# Patient Record
Sex: Female | Born: 2000 | Race: Black or African American | Hispanic: No | Marital: Single | Smoking: Former smoker
Health system: Southern US, Community
[De-identification: ages and names within clinical notes are randomized; demographics above are authoritative.]

## PROBLEM LIST (undated history)

## (undated) ENCOUNTER — Inpatient Hospital Stay (HOSPITAL_COMMUNITY): Payer: MEDICAID

## (undated) ENCOUNTER — Inpatient Hospital Stay (HOSPITAL_COMMUNITY): Payer: Self-pay

## (undated) DIAGNOSIS — R55 Syncope and collapse: Secondary | ICD-10-CM

## (undated) DIAGNOSIS — F319 Bipolar disorder, unspecified: Secondary | ICD-10-CM

## (undated) DIAGNOSIS — T7422XA Child sexual abuse, confirmed, initial encounter: Secondary | ICD-10-CM

## (undated) DIAGNOSIS — J45909 Unspecified asthma, uncomplicated: Secondary | ICD-10-CM

## (undated) DIAGNOSIS — F4320 Adjustment disorder, unspecified: Secondary | ICD-10-CM

## (undated) HISTORY — PX: NO PAST SURGERIES: SHX2092

## (undated) HISTORY — DX: Unspecified asthma, uncomplicated: J45.909

## (undated) HISTORY — DX: Syncope and collapse: R55

## (undated) HISTORY — DX: Child sexual abuse, confirmed, initial encounter: T74.22XA

## (undated) HISTORY — DX: Adjustment disorder, unspecified: F43.20

---
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
## 2021-03-24 ENCOUNTER — Inpatient Hospital Stay (HOSPITAL_COMMUNITY)
Admission: AD | Admit: 2021-03-24 | Discharge: 2021-03-24 | Disposition: A | Discharge status: Home / Self Care | Payer: Medicaid Other | Attending: Obstetrics and Gynecology | Admitting: Obstetrics and Gynecology

## 2021-03-24 ENCOUNTER — Other Ambulatory Visit: Payer: Self-pay

## 2021-03-24 ENCOUNTER — Encounter (HOSPITAL_COMMUNITY): Payer: Self-pay | Admitting: Obstetrics and Gynecology

## 2021-03-24 DIAGNOSIS — Z3A32 32 weeks gestation of pregnancy: Secondary | ICD-10-CM | POA: Diagnosis not present

## 2021-03-24 DIAGNOSIS — O99891 Other specified diseases and conditions complicating pregnancy: Secondary | ICD-10-CM | POA: Diagnosis not present

## 2021-03-24 DIAGNOSIS — R197 Diarrhea, unspecified: Secondary | ICD-10-CM | POA: Diagnosis not present

## 2021-03-24 DIAGNOSIS — R1032 Left lower quadrant pain: Secondary | ICD-10-CM | POA: Insufficient documentation

## 2021-03-24 DIAGNOSIS — R10817 Generalized abdominal tenderness: Secondary | ICD-10-CM | POA: Diagnosis not present

## 2021-03-24 DIAGNOSIS — O99613 Diseases of the digestive system complicating pregnancy, third trimester: Secondary | ICD-10-CM | POA: Insufficient documentation

## 2021-03-24 DIAGNOSIS — O26893 Other specified pregnancy related conditions, third trimester: Secondary | ICD-10-CM | POA: Insufficient documentation

## 2021-03-24 DIAGNOSIS — N9489 Other specified conditions associated with female genital organs and menstrual cycle: Secondary | ICD-10-CM | POA: Diagnosis not present

## 2021-03-24 DIAGNOSIS — Z87891 Personal history of nicotine dependence: Secondary | ICD-10-CM | POA: Diagnosis not present

## 2021-03-24 LAB — URINALYSIS, ROUTINE W REFLEX MICROSCOPIC
Bilirubin Urine: NEGATIVE
Glucose, UA: 250 mg/dL — AB
Hgb urine dipstick: NEGATIVE
Ketones, ur: 15 mg/dL — AB
Leukocytes,Ua: NEGATIVE
Nitrite: NEGATIVE
Protein, ur: NEGATIVE mg/dL
Specific Gravity, Urine: 1.02 (ref 1.005–1.030)
pH: 7 (ref 5.0–8.0)

## 2021-03-24 MED ORDER — DICYCLOMINE HCL 10 MG PO CAPS
20.0000 mg | ORAL_CAPSULE | Freq: Once | ORAL | Status: AC
  Administered 2021-03-24: 20 mg via ORAL
  Filled 2021-03-24: qty 2

## 2021-03-24 MED ORDER — DICYCLOMINE HCL 10 MG PO CAPS: 10.0000 mg | ORAL_CAPSULE | Freq: Four times a day (QID) | ORAL | 0 refills | Status: DC | PRN

## 2021-03-24 MED ORDER — LOPERAMIDE HCL 2 MG PO CAPS
2.0000 mg | ORAL_CAPSULE | Freq: Once | ORAL | Status: AC
  Administered 2021-03-24: 2 mg via ORAL
  Filled 2021-03-24: qty 1

## 2021-03-24 MED ORDER — TERCONAZOLE 0.4 % VA CREA: 1.0000 | TOPICAL_CREAM | Freq: Every day | VAGINAL | 0 refills | Status: DC
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
## 2021-03-24 NOTE — MAU Note (Signed)
Taylor Brennan is a 20 y.o. at [redacted]w[redacted]d here in MAU reporting: for the past 3 days has been having sharp left sided abdominal pain. Pain is intermittent. Also has has been having diarrhea.  Onset of complaint: ongoing  Pain score: 8/10  Vitals:   03/24/21 1433  BP: 119/74  Pulse: (!) 116  Resp: 16  Temp: 98.4 F (36.9 C)  SpO2: 98%     FHT: +FM  Lab orders placed from triage: UA

## 2021-03-25 ENCOUNTER — Encounter (HOSPITAL_COMMUNITY): Payer: Self-pay | Admitting: Obstetrics and Gynecology

## 2021-03-25 ENCOUNTER — Inpatient Hospital Stay (HOSPITAL_COMMUNITY)
Admission: AD | Admit: 2021-03-25 | Discharge: 2021-03-25 | Disposition: A | Discharge status: Home / Self Care | Payer: Medicaid Other | Attending: Obstetrics and Gynecology | Admitting: Obstetrics and Gynecology

## 2021-03-25 DIAGNOSIS — Z3A32 32 weeks gestation of pregnancy: Secondary | ICD-10-CM | POA: Diagnosis not present

## 2021-03-25 DIAGNOSIS — R1032 Left lower quadrant pain: Secondary | ICD-10-CM | POA: Diagnosis not present

## 2021-03-25 DIAGNOSIS — O99891 Other specified diseases and conditions complicating pregnancy: Secondary | ICD-10-CM | POA: Insufficient documentation

## 2021-03-25 DIAGNOSIS — N9489 Other specified conditions associated with female genital organs and menstrual cycle: Secondary | ICD-10-CM | POA: Diagnosis not present

## 2021-03-25 DIAGNOSIS — R102 Pelvic and perineal pain: Secondary | ICD-10-CM | POA: Diagnosis not present

## 2021-03-25 DIAGNOSIS — Z7982 Long term (current) use of aspirin: Secondary | ICD-10-CM | POA: Insufficient documentation

## 2021-03-25 DIAGNOSIS — Z3689 Encounter for other specified antenatal screening: Secondary | ICD-10-CM

## 2021-03-25 DIAGNOSIS — O26893 Other specified pregnancy related conditions, third trimester: Secondary | ICD-10-CM

## 2021-03-25 DIAGNOSIS — N949 Unspecified condition associated with female genital organs and menstrual cycle: Secondary | ICD-10-CM

## 2021-03-25 DIAGNOSIS — R109 Unspecified abdominal pain: Secondary | ICD-10-CM | POA: Diagnosis present

## 2021-03-25 DIAGNOSIS — R197 Diarrhea, unspecified: Secondary | ICD-10-CM | POA: Diagnosis not present

## 2021-03-25 DIAGNOSIS — R10817 Generalized abdominal tenderness: Secondary | ICD-10-CM | POA: Diagnosis not present

## 2021-03-25 LAB — URINALYSIS, ROUTINE W REFLEX MICROSCOPIC
Bilirubin Urine: NEGATIVE
Glucose, UA: NEGATIVE mg/dL
Hgb urine dipstick: NEGATIVE
Ketones, ur: NEGATIVE mg/dL
Leukocytes,Ua: NEGATIVE
Nitrite: NEGATIVE
Protein, ur: NEGATIVE mg/dL
Specific Gravity, Urine: 1.012 (ref 1.005–1.030)
pH: 7 (ref 5.0–8.0)

## 2021-03-25 NOTE — MAU Note (Signed)
Was here yesterday, told to come back if pain got worse.  Pt is having sharp pain in LLQ.  Worse when she walks. States pain has been constant for 4 days now. Denies bleeding or leaking.  Reports heavy d/c. +FM. Has been having diarrhea, start 4 days ago, about the same time. Loose 5/day. No urinary problems.
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
## 2021-04-05 LAB — OB RESULTS CONSOLE GBS: GBS: POSITIVE

## 2021-05-09 ENCOUNTER — Telehealth (HOSPITAL_COMMUNITY): Payer: Self-pay | Admitting: *Deleted

## 2021-05-09 NOTE — Telephone Encounter (Signed)
Preadmission screen  

## 2021-05-13 ENCOUNTER — Encounter (HOSPITAL_COMMUNITY): Payer: Self-pay | Admitting: Obstetrics and Gynecology

## 2021-05-13 ENCOUNTER — Inpatient Hospital Stay (HOSPITAL_COMMUNITY)
Admission: AD | Admit: 2021-05-13 | Discharge: 2021-05-13 | Disposition: A | Discharge status: Home / Self Care | Payer: Medicaid Other | Attending: Obstetrics and Gynecology | Admitting: Obstetrics and Gynecology

## 2021-05-13 DIAGNOSIS — O479 False labor, unspecified: Secondary | ICD-10-CM

## 2021-05-13 DIAGNOSIS — Z3A39 39 weeks gestation of pregnancy: Secondary | ICD-10-CM

## 2021-05-13 DIAGNOSIS — O471 False labor at or after 37 completed weeks of gestation: Secondary | ICD-10-CM | POA: Diagnosis not present

## 2021-05-13 DIAGNOSIS — Z3689 Encounter for other specified antenatal screening: Secondary | ICD-10-CM

## 2021-05-13 DIAGNOSIS — O4700 False labor before 37 completed weeks of gestation, unspecified trimester: Secondary | ICD-10-CM | POA: Diagnosis present

## 2021-05-13 NOTE — MAU Provider Note (Signed)
Per RN: Ms. Jeslie Lowe is a 21 y.o. G1P0000 at [redacted]w[redacted]d  who presents to MAU today complaining contractions q 15 minutes since last night. No VB or LOF. +FM.   O: BP 120/76   Pulse 96   Temp 98.7 F (37.1 C) (Oral)   Resp 16   LMP 07/16/2020   SpO2 98%   Cervical exam:  Dilation: 1 Effacement (%): 50 Cervical Position: Posterior Station: -2 Presentation: Vertex Exam by:: weston,rn  Fetal Monitoring: Baseline: 135 Variability: mod Accelerations: + Decelerations: no Contractions: irreg  MDM: no cervical change, no signs of labor. Stable for discharge home.   A: SIUP at [redacted]w[redacted]d  False labor Reactive NST  P: Discharge home Follow up [facility] as scheduled Labor precautions  [facility], CNM 05/13/2021 7:25 PM

## 2021-05-13 NOTE — ED Provider Notes (Signed)
Emergency Medicine Provider OB Triage Evaluation Note  Ileanna Bueno is a 20 y.o. female, G1P0000, at [redacted]w[redacted]d gestation who presents to the emergency department with complaints of contractions.  Patient reports that she has been having contractions since last night they were initially irregular but over the past hour she has been having them consistently every 15 minutes.  Currently she does not feel like she is having a contraction, last one was 10 minutes ago.  She is not feeling the urge to push.  She has not had any loss of fluids or vaginal bleeding.  Does report having some vaginal discharge intermittently.  No fevers or chills.  No nausea or vomiting.  Review of  Systems  Positive: Abdominal pain, contractions Negative: Vaginal bleeding, leakage of fluids, fevers, vomiting  Physical Exam  BP (!) 138/95 (BP Location: Left Arm)   Pulse (!) 117   Temp 99 F (37.2 C)   Resp 18   LMP 07/16/2020   SpO2 100%  General: Awake, no distress  HEENT: Atraumatic  Resp: Normal effort  Cardiac: Normal rate Abd: Gravid abdomen, nontender MSK: Moves all extremities without difficulty Neuro: Speech clear  Medical Decision Making  Pt evaluated for pregnancy concern and is stable for transfer to MAU. Pt is in agreement with plan for transfer.  5:34 PM Discussed with MAU provider, Dr. Adrian Blackwater, who accepts patient in transfer.  Clinical Impression   1. Uterine contractions   2. [redacted] weeks gestation of pregnancy        Legrand Rams 05/13/21 1736    Terald Sleeper, MD 05/13/21 1744

## 2021-05-13 NOTE — MAU Note (Signed)
.  Taylor Brennan is a 20 y.o. at [redacted]w[redacted]d here in MAU reporting: ctx q15 that started last night. She states they got worse about 30 minutes ago and she came in to get checked out. Denies VB or LOF. Endorses good fetal movement. States she has been having some diarrhea since last night as well. Denies n/v.   Pain score: 5 Vitals:   05/13/21 1729 05/13/21 1752  BP: (!) 138/95 110/68  Pulse: (!) 117 (!) 119  Resp: 18 15  Temp: 99 F (37.2 C) 98.7 F (37.1 C)  SpO2: 100% 98%     FHT:141

## 2021-05-15 ENCOUNTER — Encounter (HOSPITAL_COMMUNITY): Payer: Self-pay

## 2021-05-15 ENCOUNTER — Ambulatory Visit (HOSPITAL_COMMUNITY)
Admission: RE | Admit: 2021-05-15 | Discharge: 2021-05-15 | Disposition: A | Discharge status: Home / Self Care | Payer: Medicaid Other | Source: Ambulatory Visit | Attending: Family Medicine | Admitting: Family Medicine

## 2021-05-15 ENCOUNTER — Other Ambulatory Visit: Payer: Self-pay

## 2021-05-15 VITALS — BP 125/73 | HR 111 | Temp 97.8°F | Resp 19

## 2021-05-15 DIAGNOSIS — M654 Radial styloid tenosynovitis [de Quervain]: Secondary | ICD-10-CM | POA: Diagnosis not present

## 2021-05-15 MED ORDER — PREDNISONE 20 MG PO TABS: 40.0000 mg | ORAL_TABLET | Freq: Every day | ORAL | 0 refills | Status: DC

## 2021-05-15 NOTE — ED Triage Notes (Signed)
Pt presents with complaints of pain in her left thumb x 1 week. The patient injured her thumb last year. She thinks she slept on it wrong and flared up some pain.
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
## 2021-05-16 ENCOUNTER — Inpatient Hospital Stay (HOSPITAL_COMMUNITY)
Admission: AD | Admit: 2021-05-16 | Discharge: 2021-05-19 | DRG: 807 | Disposition: A | Discharge status: Home / Self Care | Payer: Medicaid Other | Attending: Obstetrics and Gynecology | Admitting: Obstetrics and Gynecology

## 2021-05-16 ENCOUNTER — Encounter (HOSPITAL_COMMUNITY): Payer: Self-pay | Admitting: Obstetrics and Gynecology

## 2021-05-16 ENCOUNTER — Inpatient Hospital Stay (HOSPITAL_COMMUNITY): Payer: Medicaid Other | Admitting: Anesthesiology

## 2021-05-16 ENCOUNTER — Other Ambulatory Visit: Payer: Self-pay

## 2021-05-16 ENCOUNTER — Other Ambulatory Visit: Payer: Self-pay | Admitting: Obstetrics and Gynecology

## 2021-05-16 DIAGNOSIS — O480 Post-term pregnancy: Principal | ICD-10-CM | POA: Diagnosis present

## 2021-05-16 DIAGNOSIS — O99824 Streptococcus B carrier state complicating childbirth: Secondary | ICD-10-CM | POA: Diagnosis present

## 2021-05-16 DIAGNOSIS — Z87891 Personal history of nicotine dependence: Secondary | ICD-10-CM

## 2021-05-16 DIAGNOSIS — Z3A41 41 weeks gestation of pregnancy: Secondary | ICD-10-CM

## 2021-05-16 DIAGNOSIS — Z20822 Contact with and (suspected) exposure to covid-19: Secondary | ICD-10-CM | POA: Diagnosis present

## 2021-05-16 LAB — RESP PANEL BY RT-PCR (FLU A&B, COVID) ARPGX2
Influenza A by PCR: NEGATIVE
Influenza B by PCR: NEGATIVE
SARS Coronavirus 2 by RT PCR: NEGATIVE

## 2021-05-16 LAB — TYPE AND SCREEN
ABO/RH(D): O POS
Antibody Screen: NEGATIVE

## 2021-05-16 LAB — CBC
HCT: 37 % (ref 36.0–46.0)
Hemoglobin: 13 g/dL (ref 12.0–15.0)
MCH: 35.3 pg — ABNORMAL HIGH (ref 26.0–34.0)
MCHC: 35.1 g/dL (ref 30.0–36.0)
MCV: 100.5 fL — ABNORMAL HIGH (ref 80.0–100.0)
Platelets: 250 10*3/uL (ref 150–400)
RBC: 3.68 MIL/uL — ABNORMAL LOW (ref 3.87–5.11)
RDW: 12.7 % (ref 11.5–15.5)
WBC: 7.6 10*3/uL (ref 4.0–10.5)
nRBC: 0 % (ref 0.0–0.2)

## 2021-05-16 MED ORDER — ACETAMINOPHEN 325 MG PO TABS: 650.0000 mg | ORAL_TABLET | ORAL | Status: DC | PRN

## 2021-05-16 MED ORDER — OXYCODONE-ACETAMINOPHEN 5-325 MG PO TABS: 2.0000 | ORAL_TABLET | ORAL | Status: DC | PRN

## 2021-05-16 MED ORDER — EPHEDRINE 5 MG/ML INJ: 10.0000 mg | INTRAVENOUS | Status: DC | PRN

## 2021-05-16 MED ORDER — PHENYLEPHRINE 40 MCG/ML (10ML) SYRINGE FOR IV PUSH (FOR BLOOD PRESSURE SUPPORT)
80.0000 ug | PREFILLED_SYRINGE | INTRAVENOUS | Status: AC | PRN
  Administered 2021-05-16 – 2021-05-17 (×3): 80 ug via INTRAVENOUS

## 2021-05-16 MED ORDER — LACTATED RINGERS IV SOLN: INTRAVENOUS | Status: DC

## 2021-05-16 MED ORDER — TERBUTALINE SULFATE 1 MG/ML IJ SOLN: 0.2500 mg | Freq: Once | INTRAMUSCULAR | Status: DC | PRN

## 2021-05-16 MED ORDER — SODIUM CHLORIDE 0.9 % IV SOLN
5.0000 10*6.[IU] | Freq: Once | INTRAVENOUS | Status: AC
  Administered 2021-05-16: 5 10*6.[IU] via INTRAVENOUS
  Filled 2021-05-16: qty 5

## 2021-05-16 MED ORDER — OXYTOCIN-SODIUM CHLORIDE 30-0.9 UT/500ML-% IV SOLN
2.5000 [IU]/h | INTRAVENOUS | Status: DC
  Administered 2021-05-17: 2.5 [IU]/h via INTRAVENOUS
  Filled 2021-05-16: qty 500

## 2021-05-16 MED ORDER — ONDANSETRON HCL 4 MG/2ML IJ SOLN: 4.0000 mg | Freq: Four times a day (QID) | INTRAMUSCULAR | Status: DC | PRN

## 2021-05-16 MED ORDER — LACTATED RINGERS IV SOLN
500.0000 mL | INTRAVENOUS | Status: DC | PRN
  Administered 2021-05-17: 500 mL via INTRAVENOUS

## 2021-05-16 MED ORDER — FENTANYL-BUPIVACAINE-NACL 0.5-0.125-0.9 MG/250ML-% EP SOLN
12.0000 mL/h | EPIDURAL | Status: DC | PRN
  Administered 2021-05-16: 12 mL/h via EPIDURAL
  Filled 2021-05-16: qty 250

## 2021-05-16 MED ORDER — BUTORPHANOL TARTRATE 1 MG/ML IJ SOLN: 1.0000 mg | INTRAMUSCULAR | Status: DC | PRN

## 2021-05-16 MED ORDER — LACTATED RINGERS IV SOLN
500.0000 mL | Freq: Once | INTRAVENOUS | Status: AC
  Administered 2021-05-16: 500 mL via INTRAVENOUS

## 2021-05-16 MED ORDER — DIPHENHYDRAMINE HCL 50 MG/ML IJ SOLN: 12.5000 mg | INTRAMUSCULAR | Status: DC | PRN

## 2021-05-16 MED ORDER — LIDOCAINE HCL (PF) 1 % IJ SOLN: 30.0000 mL | INTRAMUSCULAR | Status: DC | PRN

## 2021-05-16 MED ORDER — LACTATED RINGERS IV SOLN: 500.0000 mL | Freq: Once | INTRAVENOUS | Status: DC

## 2021-05-16 MED ORDER — MISOPROSTOL 25 MCG QUARTER TABLET
25.0000 ug | ORAL_TABLET | ORAL | Status: DC | PRN
  Administered 2021-05-16: 25 ug via VAGINAL
  Filled 2021-05-16 (×2): qty 1

## 2021-05-16 MED ORDER — OXYTOCIN BOLUS FROM INFUSION
333.0000 mL | Freq: Once | INTRAVENOUS | Status: AC
  Administered 2021-05-17: 333 mL via INTRAVENOUS

## 2021-05-16 MED ORDER — OXYCODONE-ACETAMINOPHEN 5-325 MG PO TABS: 1.0000 | ORAL_TABLET | ORAL | Status: DC | PRN

## 2021-05-16 MED ORDER — PENICILLIN G POT IN DEXTROSE 60000 UNIT/ML IV SOLN
3.0000 10*6.[IU] | INTRAVENOUS | Status: DC
  Administered 2021-05-16 – 2021-05-17 (×4): 3 10*6.[IU] via INTRAVENOUS
  Filled 2021-05-16 (×4): qty 50

## 2021-05-16 MED ORDER — FENTANYL-BUPIVACAINE-NACL 0.5-0.125-0.9 MG/250ML-% EP SOLN: 12.0000 mL/h | EPIDURAL | Status: DC | PRN

## 2021-05-16 MED ORDER — OXYTOCIN-SODIUM CHLORIDE 30-0.9 UT/500ML-% IV SOLN
1.0000 m[IU]/min | INTRAVENOUS | Status: DC
Start: 2021-05-16 — End: 2021-05-17
  Administered 2021-05-16: 2 m[IU]/min via INTRAVENOUS

## 2021-05-16 MED ORDER — PHENYLEPHRINE 40 MCG/ML (10ML) SYRINGE FOR IV PUSH (FOR BLOOD PRESSURE SUPPORT): 80.0000 ug | PREFILLED_SYRINGE | INTRAVENOUS | Status: DC | PRN

## 2021-05-16 MED ORDER — SOD CITRATE-CITRIC ACID 500-334 MG/5ML PO SOLN: 30.0000 mL | ORAL | Status: DC | PRN

## 2021-05-16 MED ORDER — PHENYLEPHRINE 40 MCG/ML (10ML) SYRINGE FOR IV PUSH (FOR BLOOD PRESSURE SUPPORT)
80.0000 ug | PREFILLED_SYRINGE | INTRAVENOUS | Status: DC | PRN
  Administered 2021-05-17: 80 ug via INTRAVENOUS
  Filled 2021-05-16: qty 10

## 2021-05-16 MED ORDER — LIDOCAINE-EPINEPHRINE (PF) 2 %-1:200000 IJ SOLN
INTRAMUSCULAR | Status: DC | PRN
  Administered 2021-05-16: 5 mL via EPIDURAL

## 2021-05-16 NOTE — Anesthesia Procedure Notes (Signed)
Epidural Patient location during procedure: OB Start time: 05/16/2021 10:05 PM End time: 05/16/2021 10:11 PM  Staffing Anesthesiologist: Shelton Silvas, MD Performed: anesthesiologist   Preanesthetic Checklist Completed: patient identified, IV checked, site marked, risks and benefits discussed, surgical consent, monitors and equipment checked, pre-op evaluation and timeout performed  Epidural Patient position: sitting Prep: DuraPrep Patient monitoring: heart rate, continuous pulse ox and blood pressure Approach: midline Location: L3-L4 Injection technique: LOR saline  Needle:  Needle type: Tuohy  Needle gauge: 17 G Needle length: 9 cm Catheter type: closed end flexible Catheter size: 20 Guage Test dose: negative and 1.5% lidocaine  Assessment Events: blood not aspirated, injection not painful, no injection resistance and no paresthesia  Additional Notes LOR @ 6  Patient identified. Risks/Benefits/Options discussed with patient including but not limited to bleeding, infection, nerve damage, paralysis, failed block, incomplete pain control, headache, blood pressure changes, nausea, vomiting, reactions to medications, itching and postpartum back pain. Confirmed with bedside nurse the patient's most recent platelet count. Confirmed with patient that they are not currently taking any anticoagulation, have any bleeding history or any family history of bleeding disorders. Patient expressed understanding and wished to proceed. All questions were answered. Sterile technique was used throughout the entire procedure. Please see nursing notes for vital signs. Test dose was given through epidural catheter and negative prior to continuing to dose epidural or start infusion. Warning signs of high block given to the patient including shortness of breath, tingling/numbness in hands, complete motor block, or any concerning symptoms with instructions to call for help. Patient was given instructions  on fall risk and not to get out of bed. All questions and concerns addressed with instructions to call with any issues or inadequate analgesia.    Reason for block:procedure for pain
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
## 2021-05-16 NOTE — Progress Notes (Signed)
Patient very uncomfortable, desires CE 3-4/90/-2, desires epidural at this time. TOCO q64m on 33mU/min pitocin.  BP 136/76   Pulse 95   Temp 98.1 F (36.7 C) (Oral)   Resp 16   Ht 5\' 2"  (1.575 m)   Wt 88.8 kg   LMP 07/16/2020   BMI 35.79 kg/m

## 2021-05-16 NOTE — Anesthesia Preprocedure Evaluation (Addendum)
Anesthesia Evaluation  Patient identified by MRN, date of birth, ID band Patient awake    Reviewed: Allergy & Precautions, Patient's Chart, lab work & pertinent test results  Airway Mallampati: II       Dental no notable dental hx.    Pulmonary asthma , former smoker,    Pulmonary exam normal        Cardiovascular negative cardio ROS Normal cardiovascular exam     Neuro/Psych PSYCHIATRIC DISORDERS Bipolar Disorder    GI/Hepatic negative GI ROS, Neg liver ROS,   Endo/Other    Renal/GU negative Renal ROS     Musculoskeletal   Abdominal Normal abdominal exam  (+)   Peds  Hematology   Anesthesia Other Findings   Reproductive/Obstetrics (+) Pregnancy                            Anesthesia Physical Anesthesia Plan  ASA: 2  Anesthesia Plan: Epidural   Post-op Pain Management:    Induction:   PONV Risk Score and Plan: 0  Airway Management Planned: Natural Airway  Additional Equipment: None  Intra-op Plan:   Post-operative Plan:   Informed Consent: I have reviewed the patients History and Physical, chart, labs and discussed the procedure including the risks, benefits and alternatives for the proposed anesthesia with the patient or authorized representative who has indicated his/her understanding and acceptance.       Plan Discussed with:   Anesthesia Plan Comments: (Lab Results      Component                Value               Date                      WBC                      7.6                 05/16/2021                HGB                      13.0                05/16/2021                HCT                      37.0                05/16/2021                MCV                      100.5 (H)           05/16/2021                PLT                      250                 05/16/2021           )       Anesthesia Quick Evaluation

## 2021-05-16 NOTE — Progress Notes (Signed)
Patient cramping after cytotec x1. No other complaints CE 2/80/-2 TOCO q3-3m BP 125/84   Pulse (!) 102   Temp 98.5 F (36.9 C) (Oral)   Resp 16   Ht 5\' 2"  (1.575 m)   Wt 88.8 kg   LMP 07/16/2020   BMI 35.79 kg/m  Initiate pitocin at this time, still palpates intact.  Category 1 tracing

## 2021-05-17 ENCOUNTER — Inpatient Hospital Stay (HOSPITAL_COMMUNITY): Payer: Medicaid Other

## 2021-05-17 ENCOUNTER — Inpatient Hospital Stay (HOSPITAL_COMMUNITY)
Admission: AD | Admit: 2021-05-17 | Payer: Medicaid Other | Source: Home / Self Care | Admitting: Obstetrics and Gynecology

## 2021-05-17 ENCOUNTER — Encounter (HOSPITAL_COMMUNITY): Payer: Self-pay | Admitting: Obstetrics and Gynecology

## 2021-05-17 LAB — RPR: RPR Ser Ql: NONREACTIVE

## 2021-05-17 MED ORDER — DIPHENHYDRAMINE HCL 25 MG PO CAPS: 25.0000 mg | ORAL_CAPSULE | Freq: Four times a day (QID) | ORAL | Status: DC | PRN

## 2021-05-17 MED ORDER — IBUPROFEN 600 MG PO TABS
600.0000 mg | ORAL_TABLET | Freq: Four times a day (QID) | ORAL | Status: DC
  Administered 2021-05-17 – 2021-05-19 (×7): 600 mg via ORAL
  Filled 2021-05-17 (×7): qty 1

## 2021-05-17 MED ORDER — ONDANSETRON HCL 4 MG PO TABS: 4.0000 mg | ORAL_TABLET | ORAL | Status: DC | PRN

## 2021-05-17 MED ORDER — OXYCODONE HCL 5 MG PO TABS: 5.0000 mg | ORAL_TABLET | ORAL | Status: DC | PRN

## 2021-05-17 MED ORDER — DIPHENHYDRAMINE HCL 50 MG/ML IJ SOLN
50.0000 mg | Freq: Once | INTRAMUSCULAR | Status: AC
  Administered 2021-05-17: 50 mg via INTRAVENOUS
  Filled 2021-05-17: qty 1

## 2021-05-17 MED ORDER — COCONUT OIL OIL
1.0000 "application " | TOPICAL_OIL | Status: DC | PRN
  Administered 2021-05-18: 1 via TOPICAL

## 2021-05-17 MED ORDER — SENNOSIDES-DOCUSATE SODIUM 8.6-50 MG PO TABS
2.0000 | ORAL_TABLET | Freq: Every day | ORAL | Status: DC
  Administered 2021-05-18 – 2021-05-19 (×2): 2 via ORAL
  Filled 2021-05-17 (×2): qty 2

## 2021-05-17 MED ORDER — ONDANSETRON HCL 4 MG/2ML IJ SOLN: 4.0000 mg | INTRAMUSCULAR | Status: DC | PRN

## 2021-05-17 MED ORDER — OXYCODONE HCL 5 MG PO TABS: 10.0000 mg | ORAL_TABLET | ORAL | Status: DC | PRN

## 2021-05-17 MED ORDER — BENZOCAINE-MENTHOL 20-0.5 % EX AERO
1.0000 "application " | INHALATION_SPRAY | CUTANEOUS | Status: DC | PRN
  Filled 2021-05-17: qty 56

## 2021-05-17 MED ORDER — SIMETHICONE 80 MG PO CHEW: 80.0000 mg | CHEWABLE_TABLET | ORAL | Status: DC | PRN

## 2021-05-17 MED ORDER — DIBUCAINE (PERIANAL) 1 % EX OINT: 1.0000 "application " | TOPICAL_OINTMENT | CUTANEOUS | Status: DC | PRN

## 2021-05-17 MED ORDER — METHYLERGONOVINE MALEATE 0.2 MG/ML IJ SOLN: 0.2000 mg | INTRAMUSCULAR | Status: DC | PRN

## 2021-05-17 MED ORDER — PRENATAL MULTIVITAMIN CH
1.0000 | ORAL_TABLET | Freq: Every day | ORAL | Status: DC
  Administered 2021-05-18: 1 via ORAL
  Filled 2021-05-17: qty 1

## 2021-05-17 MED ORDER — WITCH HAZEL-GLYCERIN EX PADS: 1.0000 "application " | MEDICATED_PAD | CUTANEOUS | Status: DC | PRN

## 2021-05-17 MED ORDER — OXYTOCIN-SODIUM CHLORIDE 30-0.9 UT/500ML-% IV SOLN: 2.5000 [IU]/h | INTRAVENOUS | Status: DC | PRN

## 2021-05-17 MED ORDER — ZOLPIDEM TARTRATE 5 MG PO TABS: 5.0000 mg | ORAL_TABLET | Freq: Every evening | ORAL | Status: DC | PRN

## 2021-05-17 MED ORDER — TETANUS-DIPHTH-ACELL PERTUSSIS 5-2.5-18.5 LF-MCG/0.5 IM SUSY: 0.5000 mL | PREFILLED_SYRINGE | Freq: Once | INTRAMUSCULAR | Status: DC

## 2021-05-17 MED ORDER — METHYLERGONOVINE MALEATE 0.2 MG PO TABS: 0.2000 mg | ORAL_TABLET | ORAL | Status: DC | PRN

## 2021-05-17 MED ORDER — ACETAMINOPHEN 325 MG PO TABS: 650.0000 mg | ORAL_TABLET | ORAL | Status: DC | PRN

## 2021-05-17 NOTE — Lactation Note (Signed)
This note was copied from a baby's chart. Lactation Consultation Note  Patient Name: Girl Ramia Sidney Today's Date: 05/17/2021 Reason for consult: L&D Initial assessment Age:19 hours P1, multiple attempts to latch infant . Mother has a short nipple.  Infant took a few short shallow sucks. Observed that infant has very large gums and a high palate. Infant suckled on gloved finger , attempt for 15 mins and unable to achieve a latch.  Assist mother with STS, Encouraged mother that she would have continued assistance with breastfeeding.  Encouraged cue base feeding . Mother to feed infant 8-12 times in 24 hours or more.  Mother informed that she will be seen again when she gets to her room.   Maternal Data    Feeding Mother's Current Feeding Choice: Breast Milk  LATCH Score Latch: Repeated attempts needed to sustain latch, nipple held in mouth throughout feeding, stimulation needed to elicit sucking reflex.  Audible Swallowing: None  Type of Nipple: Everted at rest and after stimulation (nipple short)  Comfort (Breast/Nipple): Soft / non-tender  Hold (Positioning): Full assist, staff holds infant at breast  LATCH Score: 5   Lactation Tools Discussed/Used    Interventions Interventions: Breast feeding basics reviewed;Assisted with latch;Hand express;Adjust position  Discharge    Consult Status Consult Status: Follow-up from L&D    Stevan Born [facility] 05/17/2021, 1:55 PM

## 2021-05-17 NOTE — Progress Notes (Signed)
OB Progress Note  S: pt feeling mild vaginal pressure   O: Today's Vitals   05/17/21 0731 05/17/21 0801 05/17/21 0831 05/17/21 0833  BP: 118/70 (!) 104/54 112/63 112/63  Pulse: 99 91 94 94  Resp: 17     Temp:    98.6 F (37 C)  TempSrc:      Weight:      Height:      PainSc:       Body mass index is 35.79 kg/m.  SVE 8.5/90/0, AROM thick meconium  FHR: 130bpm, moderate variability, + accels, no decels Toco: ctx q 2 mins   A/P: 20Y G1P0 @ [redacted]w[redacted]d, IOL for nonreactive NST Fetal wellbeing: cat I tracing IOL: s/p cytotec, s/p AROM, continue pitocin currently at 47mu/min, anticipate SVD Pain control: epidural GBS pos: continue penicillin  M. Timothy Lasso, MD 05/17/21 8:44 AM

## 2021-05-17 NOTE — Progress Notes (Signed)
Patient comfortable after epidural BP 115/76   Pulse 99   Temp (!) 97.5 F (36.4 C) (Oral)   Resp 16   Ht 5\' 2"  (1.575 m)   Wt 88.8 kg   LMP 07/16/2020   BMI 35.79 kg/m  CE 8/90/0, intact, mild anterior cervical edema  Continue PCN for GBS ppx. Reposition to high fowlers. Pitocin at 8, Cat 1 tracing

## 2021-05-17 NOTE — Progress Notes (Signed)
Upon admission I told the patient to call for help however no one could get in there fast enough so patient got up with her mom. Patient stated she didn't feel dizzy or felt like she was gonna fall or buckle. So I told mom since she felt ok she can get up on her own. I assisted her with a latch infant was really sleepy. I hand expressed just a few drops.

## 2021-05-18 LAB — CBC
HCT: 32.4 % — ABNORMAL LOW (ref 36.0–46.0)
Hemoglobin: 11 g/dL — ABNORMAL LOW (ref 12.0–15.0)
MCH: 34.5 pg — ABNORMAL HIGH (ref 26.0–34.0)
MCHC: 34 g/dL (ref 30.0–36.0)
MCV: 101.6 fL — ABNORMAL HIGH (ref 80.0–100.0)
Platelets: 204 10*3/uL (ref 150–400)
RBC: 3.19 MIL/uL — ABNORMAL LOW (ref 3.87–5.11)
RDW: 12.8 % (ref 11.5–15.5)
WBC: 12.7 10*3/uL — ABNORMAL HIGH (ref 4.0–10.5)
nRBC: 0 % (ref 0.0–0.2)

## 2021-05-18 NOTE — Progress Notes (Signed)
Post Partum Day 1 Subjective: no complaints, up ad lib, voiding, tolerating PO, + flatus, and lochia mild. She is working on Administrator. Bonding well with baby. She denies HA, CP, SOB or lightheadedness.   Objective: Blood pressure 115/79, pulse 96, temperature 98.1 F (36.7 C), temperature source Oral, resp. rate 18, height 5\' 2"  (1.575 m), weight 88.8 kg, last menstrual period 07/16/2020, SpO2 98 %, unknown if currently breastfeeding.  Physical Exam:  General: alert, cooperative, and no distress Lochia: appropriate Uterine Fundus: firm Incision: n/a DVT Evaluation: No evidence of DVT seen on physical exam.  Recent Labs    05/16/21 1608 05/18/21 0439  HGB 13.0 11.0*  HCT 37.0 32.4*    Assessment/Plan: Plan for discharge tomorrow and Lactation consult Routine pp care   LOS: 2 days   Taylor Brennan 05/18/2021, 12:41 PM

## 2021-05-18 NOTE — Anesthesia Postprocedure Evaluation (Signed)
Anesthesia Post Note  Patient: Taylor Brennan  Procedure(s) Performed: AN AD HOC LABOR EPIDURAL     Patient location during evaluation: Mother Baby Anesthesia Type: Epidural Level of consciousness: awake and alert Pain management: pain level controlled Vital Signs Assessment: post-procedure vital signs reviewed and stable Respiratory status: spontaneous breathing, nonlabored ventilation and respiratory function stable Cardiovascular status: stable Postop Assessment: no headache, no backache and epidural receding Anesthetic complications: no   No notable events documented.  Last Vitals:  Vitals:   05/17/21 2339 05/18/21 0422  BP: 121/66 115/79  Pulse: 93 96  Resp: 18 18  Temp: 36.7 C 36.7 C  SpO2: 99% 98%    Last Pain:  Vitals:   05/18/21 0423  TempSrc:   PainSc: 0-No pain   Pain Goal:                   Taylor Brennan

## 2021-05-18 NOTE — Lactation Note (Signed)
This note was copied from a baby's chart. Lactation Consultation Note  Patient Name: Taylor Brennan SMOLM'B Date: 05/18/2021 Reason for consult: Follow-up assessment;1st time breastfeeding;Primapara;Term;Nipple pain/trauma Age:19 hours 27 yr old Mom.    LC requested by Mom.  Baby postdates and at 2% weight loss.  Mom complaining of soreness with some latches.  No visible trauma noted on nipples.  Offered to assist/assess baby at the breast.  Baby placed in football hold on left breast.  Reviewed breast massage and hand expression.  Unable to express colostrum before feeding.  Baby able to attain a deep latch to breast.  Identified deep jaw extensions and swallows.  Mom reported the latch felt more comfortable.    Encouraged STS and offering the breast with feeding cues.  Mom to ask for help prn  LATCH Score Latch: Grasps breast easily, tongue down, lips flanged, rhythmical sucking.  Audible Swallowing: Spontaneous and intermittent  Type of Nipple: Everted at rest and after stimulation  Comfort (Breast/Nipple): Soft / non-tender  Hold (Positioning): Assistance needed to correctly position infant at breast and maintain latch.  LATCH Score: 9  Interventions Interventions: Breast feeding basics reviewed;Assisted with latch;Skin to skin;Breast massage;Hand express;Adjust position;Support pillows;Position options;Breast compression Consult Status Consult Status: Follow-up Date: 05/19/21 Follow-up type: In-patient    Taylor Brennan 05/18/2021, 12:17 PM

## 2021-05-18 NOTE — Discharge Instructions (Addendum)
Call office with any concerns [phone] 
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
## 2021-05-19 MED ORDER — IBUPROFEN 600 MG PO TABS: 600.0000 mg | ORAL_TABLET | Freq: Four times a day (QID) | ORAL | 1 refills | Status: DC | PRN

## 2021-05-19 NOTE — Discharge Summary (Signed)
Postpartum Discharge Summary  Date of Service updated      Patient Name: Taylor Brennan DOB: 02/21/2001 MRN: [id number]  Date of admission: 05/16/2021 Delivery date:05/17/2021  Delivering provider: Waynard Reeds  Date of discharge: 05/19/2021  Admitting diagnosis: Post term pregnancy over 40 weeks [O48.0] Spontaneous vaginal delivery [O80] Intrauterine pregnancy: [redacted]w[redacted]d     Secondary diagnosis:  Active Problems:   Post term pregnancy over 40 weeks   Spontaneous vaginal delivery  Additional problems: Teen pregnancy    Discharge diagnosis: Term Pregnancy Delivered                                              Post partum procedures: n/a Augmentation: Pitocin and Cytotec Complications: None  Hospital course: Induction of Labor With Vaginal Delivery   20 y.o. yo G1P1001 at [redacted]w[redacted]d was admitted to the hospital 05/16/2021 for induction of labor.  Indication for induction: Postdates and cat 2 strip .  Patient had an uncomplicated labor course as follows: Membrane Rupture Time/Date: 8:39 AM ,05/17/2021   Delivery Method:Vaginal, Spontaneous  Episiotomy: None  Lacerations:  None  Details of delivery can be found in separate delivery note.  Patient had a routine postpartum course. Patient is discharged home 05/19/21.  Newborn Data: Birth date:05/17/2021  Birth time:12:49 PM  Gender:Female  Living status:Living  Apgars:8 ,9  Weight:3620 g   Magnesium Sulfate received: No BMZ received: No  Physical exam  Vitals:   05/18/21 0422 05/18/21 1700 05/18/21 1936 05/19/21 0506  BP: 115/79 127/80 119/75 118/72  Pulse: 96 98 89 90  Resp: 18 18 20 18   Temp: 98.1 F (36.7 C) 98.4 F (36.9 C) 98.5 F (36.9 C) 98.2 F (36.8 C)  TempSrc: Oral Oral Oral Oral  SpO2: 98% 100% 98% 99%  Weight:      Height:       General: alert, cooperative, and no distress Lochia: appropriate Uterine Fundus: firm Incision: N/A DVT Evaluation: No evidence of DVT seen on physical exam. Labs: Lab  Results  Component Value Date   WBC 12.7 (H) 05/18/2021   HGB 11.0 (L) 05/18/2021   HCT 32.4 (L) 05/18/2021   MCV 101.6 (H) 05/18/2021   PLT 204 05/18/2021   CMP Latest Ref Rng & Units 09/24/2020  Glucose 70 - 99 mg/dL 85  BUN 6 - 20 mg/dL 8  Creatinine 09/26/2020 - 6.27 mg/dL 0.35  Sodium 0.09 - 381 mmol/L 135  Potassium 3.5 - 5.1 mmol/L 4.0  Chloride 98 - 111 mmol/L 106  CO2 22 - 32 mmol/L 22  Calcium 8.9 - 10.3 mg/dL 9.2  Total Protein 6.5 - 8.1 g/dL -  Total Bilirubin 0.3 - 1.2 mg/dL -  Alkaline Phos 38 - 829 U/L -  AST 15 - 41 U/L -  ALT 0 - 44 U/L -   Edinburgh Score: Edinburgh Postnatal Depression Scale Screening Tool 05/17/2021  I have been able to laugh and see the funny side of things. 0  I have looked forward with enjoyment to things. 1  I have blamed myself unnecessarily when things went wrong. 2  I have been anxious or worried for no good reason. 1  I have felt scared or panicky for no good reason. 2  Things have been getting on top of me. 0  I have been so unhappy that I have had difficulty sleeping. 2  I  have felt sad or miserable. 0  I have been so unhappy that I have been crying. 1  The thought of harming myself has occurred to me. 0  Edinburgh Postnatal Depression Scale Total 9      After visit meds:  Allergies as of 05/19/2021   No Known Allergies      Medication List     STOP taking these medications    aspirin 81 MG chewable tablet   predniSONE 20 MG tablet Commonly known as: DELTASONE       TAKE these medications    fluticasone 50 MCG/ACT nasal spray Commonly known as: FLONASE Place 1 spray into both nostrils 2 (two) times daily.   ibuprofen 600 MG tablet Commonly known as: ADVIL Take 1 tablet (600 mg total) by mouth every 6 (six) hours as needed for moderate pain or cramping.   Prenatal Vitamin 27-0.8 MG Tabs Take 1 tablet by mouth daily.   promethazine 6.25 MG/5ML syrup Commonly known as: PHENERGAN Take 10-20 mLs (12.5-25 mg  total) by mouth 4 (four) times daily as needed for nausea or vomiting.               Durable Medical Equipment  (From admission, onward)           Start     Ordered   05/18/21 1949  For home use only DME double electric breast pump  Once       Comments: ICD-10 code Z39.1   05/18/21 1949             Discharge home in stable condition Infant Feeding: Breast Infant Disposition:home with mother Discharge instruction: per After Visit Summary and Postpartum booklet. Activity: Advance as tolerated. Pelvic rest for 6 weeks.  Diet: routine diet Anticipated Birth Control: Unsure Postpartum Appointment:6 weeks Additional Postpartum F/U: Postpartum Depression checkup Future Appointments:No future appointments. Follow up Visit:  Follow-up Information     Ob/Gyn, Nestor Ramp. Schedule an appointment as soon as possible for a visit in 6 week(s).   Why: For postpartum visit Contact information: [address] [place] [address] [phone]                     05/19/2021 Cathrine Muster, DO

## 2021-05-19 NOTE — Lactation Note (Signed)
This note was copied from a baby's chart. Lactation Consultation Note  Patient Name: Taylor Brennan [id number] Date: 05/19/2021 Reason for consult: Follow-up assessment;Primapara;Term;1st time breastfeeding Age:19 hours   P1 mother whose infant is now 46 hours old.  This is a term baby at 41+2 weeks.  Mother is breast feeding and pumping.  Baby was asleep STS on mother's chest when I arrived.  Mother had no questions related to breast feeding.  She stated that she is continuing to work with baby on obtaining a big wide gape prior to latching; her nipples are sensitive.  Assessed mother's nipples to be WNL.  She is using coconut oil and I provided comfort gels for home use.  Mother has been pumping and has a bottle of 30 mls of EBM at bedside.  Mother reported that baby already consumed 30 mls.  Praised mother for having such a good supply (2 oz) of milk.  She will feed back any EBM she obtains to baby.  Mother has our OP phone number for any concerns after discharge.  No support person present at this time.    Maternal Data Has patient been taught Hand Expression?: Yes Does the patient have breastfeeding experience prior to this delivery?: No  Feeding Mother's Current Feeding Choice: Breast Milk  LATCH Score                    Lactation Tools Discussed/Used Tools: Pump;Flanges;Coconut oil;Comfort gels Flange Size: 24;27 Breast pump type: Double-Electric Breast Pump;Manual Reason for Pumping: Mother's desire Pumping frequency: PRN  Interventions Interventions: Education  Discharge Discharge Education: Engorgement and breast care Pump: DEBP;Manual;Personal WIC Program: Yes  Consult Status Consult Status: Complete Date: 05/19/21 Follow-up type: Call as needed    Taylor Brennan Taylor Brennan 05/19/2021, 9:30 AM

## 2021-05-19 NOTE — Progress Notes (Signed)
Post Partum Day 2 Subjective: no complaints, up ad lib, voiding, tolerating PO, + flatus, and lochia mild. She is making great milk supply and feels more confident about discharge to home today. She denies any pain, SOB or HA. Bonding well.   Objective: Blood pressure 118/72, pulse 90, temperature 98.2 F (36.8 C), temperature source Oral, resp. rate 18, height 5\' 2"  (1.575 m), weight 88.8 kg, last menstrual period 07/16/2020, SpO2 99 %, unknown if currently breastfeeding.  Physical Exam:  General: alert, cooperative, and no distress Lochia: appropriate Uterine Fundus: firm Incision: n/a DVT Evaluation: No evidence of DVT seen on physical exam.  Recent Labs    05/16/21 1608 05/18/21 0439  HGB 13.0 11.0*  HCT 37.0 32.4*    Assessment/Plan: Discharge home and Breastfeeding Instructions reviewed   LOS: 3 days   Courtne Lighty W Ezechiel [place] 05/19/2021, 9:04 AM

## 2021-05-21 ENCOUNTER — Telehealth: Payer: Medicaid Other | Admitting: Physician Assistant

## 2021-05-21 ENCOUNTER — Ambulatory Visit: Payer: Self-pay | Admitting: *Deleted

## 2021-05-21 DIAGNOSIS — R102 Pelvic and perineal pain: Secondary | ICD-10-CM

## 2021-05-21 NOTE — Telephone Encounter (Signed)
Reason for Disposition  [1] Perineum pain (area between vagina and anus) is getting WORSE AND [2] > 48 hours since delivery  Answer Assessment - Initial Assessment Questions 1. SYMPTOM: "What's the main symptom you're concerned about?" (e.g., pain, swelling, stitch tightness)     Pain  at site 2. ONSET: "When did the  *No Answer*  start?"     2 days ago 3. APPEARANCE OF PERINEUM: "How does the episiotomy or wound look?" (e.g., gaping area, broken stitch, lump)     No, "Deep cut" 4. DELIVERY DATE: "When was your delivery date?"     05/17/21 5. PAIN: "Is there any pain?" If Yes, ask: "How bad is it?" (Scale: 1-10; mild, moderate, severe)   - NONE (0): no pain   - MILD (1-3): doesn't interfere with normal activities   - MODERATE (4-7): interferes with normal activities or awakens from sleep   - SEVERE (8-10): excruciating pain, doubled over, unable to do any normal activities      None presently, with movement 10/10 6. FEVER: "Do you have a fever?" If Yes, ask: "What is your temperature, how was it measured, and when did it start?"     no 7. OTHER SYMPTOMS: "Do you have any other symptoms?" (e.g., abdomen pain, vaginal discharge, pain with urination)     Odor to discharge  Protocols used: Postpartum - Episiotomy or Vaginal Laceration Symptoms-A-AH

## 2021-05-21 NOTE — Telephone Encounter (Signed)
Pt reports gave birth 05/17/21. States pain with episiotomy, 10/10 only with movement, no pain at rest, onset 2 days ago. States no bleeding, denies fever. States has been applying ice, helps some. Pt unsure who her OB/GYN is or who to follow up with. Number for [employer] [facility] [facility] for [facility] provided. Pt also did Evisit today and advised same. Care advise given per protocol, advised ED or worsening symptoms, bleeding, fever.  Pt verbalizes understanding.

## 2021-05-21 NOTE — Progress Notes (Signed)
Based on what you shared with me, I feel your condition warrants further evaluation and I recommend that you be seen in a face to face visit with your gynecologist or at one of our [facility] clinics. We are unable to provide management for vaginal complications after delivery. You need to call the OBGYN office this evening to be connected with an on call provider for further assessment and management of this issue.    NOTE: There will be NO CHARGE for this eVisit   If you are having a true medical emergency please call 911.    *Center for [facility] at [facility]             [address] [place], [place] [address] [phone] (*Take patients with no insurance)  *Center for [employer] at [facility] [address] [place], [place],  [place]  [address] [phone] (*Take patients with no insurance)  Center for [employer] at [employer]                                                             [address], [place], [place], [address] [phone]  Center for [facility] at [facility] [address], [place], [place], [address] [phone]  Center for [facility] at [facility] [address], [place], [place], [address] [phone]  Center for [facility] at [facility]                                 [address] [place], [place], [place], [address] [phone]  Center for [facility] at [facility]                                    [address], [place], [place], [address] [phone]  Center for [facility] at [facility] [address], [place], [place], [address]                              [facility] [place] [address], [place], [place], [address] [phone]  Your MyChart E-visit questionnaire answers were reviewed by a board certified advanced clinical practitioner to complete  your personal care plan based on your specific symptoms.  Thank you for using e-Visits.

## 2021-05-28 ENCOUNTER — Telehealth (HOSPITAL_COMMUNITY): Payer: Self-pay | Admitting: *Deleted

## 2021-05-28 NOTE — Telephone Encounter (Signed)
Hospital Discharge Follow-Up Call:  Patient reports that she is OK.  She is worried that her perineal tear has gotten infected.  She denies fever, but says that she is still having significant pain and has noticed a small amount of yellow/green drainage a few times.  She was not given a sitz bath in the hospital, but does have her peribottle which I encouraged her to use with each bathroom visit.  She says that she called her OB office yesterday and was told that they would try to get her in, but that they are very busy.  Encouraged her to call again today and request an appointment.  Also advised that she could come to [facility] for check if she is concerned and unable to get an appointment with her OB.  EPDS today was 6 and she says this accurately reflects that she is doing well emotionally.  Patient says that baby is well and she has no concerns about baby's health.  She reports that the baby sleeps in a [employer] in patient's bedroom.  Reviewed ABCs of Safe Sleep.

## 2021-06-18 ENCOUNTER — Ambulatory Visit (HOSPITAL_COMMUNITY)
Admission: EM | Admit: 2021-06-18 | Discharge: 2021-06-18 | Disposition: A | Discharge status: Home / Self Care | Payer: Medicaid Other

## 2021-06-18 ENCOUNTER — Other Ambulatory Visit: Payer: Self-pay

## 2021-06-18 NOTE — ED Notes (Signed)
Left before registration

## 2021-07-03 ENCOUNTER — Other Ambulatory Visit: Payer: Self-pay

## 2021-07-03 ENCOUNTER — Encounter (HOSPITAL_COMMUNITY): Payer: Self-pay | Admitting: *Deleted

## 2021-07-03 ENCOUNTER — Ambulatory Visit (HOSPITAL_COMMUNITY)
Admission: EM | Admit: 2021-07-03 | Discharge: 2021-07-03 | Disposition: A | Discharge status: Home / Self Care | Payer: Medicaid Other | Attending: Urgent Care | Admitting: Urgent Care

## 2021-07-03 ENCOUNTER — Ambulatory Visit: Payer: Medicaid Other | Admitting: Medical-Surgical

## 2021-07-03 DIAGNOSIS — J029 Acute pharyngitis, unspecified: Secondary | ICD-10-CM | POA: Insufficient documentation

## 2021-07-03 DIAGNOSIS — N760 Acute vaginitis: Secondary | ICD-10-CM | POA: Insufficient documentation

## 2021-07-03 LAB — POCT URINALYSIS DIPSTICK, ED / UC
Bilirubin Urine: NEGATIVE
Glucose, UA: NEGATIVE mg/dL
Hgb urine dipstick: NEGATIVE
Leukocytes,Ua: NEGATIVE
Nitrite: NEGATIVE
Protein, ur: NEGATIVE mg/dL
Specific Gravity, Urine: 1.02 (ref 1.005–1.030)
Urobilinogen, UA: 0.2 mg/dL (ref 0.0–1.0)
pH: 7 (ref 5.0–8.0)

## 2021-07-03 LAB — POC URINE PREG, ED: Preg Test, Ur: NEGATIVE

## 2021-07-03 MED ORDER — METRONIDAZOLE 500 MG PO TABS: 500.0000 mg | ORAL_TABLET | Freq: Two times a day (BID) | ORAL | 0 refills | Status: AC

## 2021-07-03 MED ORDER — AZITHROMYCIN 500 MG PO TABS: 1000.0000 mg | ORAL_TABLET | Freq: Every day | ORAL | 0 refills | Status: AC
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
## 2021-07-03 NOTE — ED Triage Notes (Signed)
Pt reports vag discharge and itching. Pt also reports a cough.

## 2021-07-03 NOTE — Discharge Instructions (Signed)
Please take flagyl twice daily until gone. Avoid any alcohol while taking. Take azithromycin in one single dose. Please use backup contraception such as a condom to prevent STIs. Being monogamous with only one partner will also cut back on this risk. Being post-partum is NOT a prevention of pregnancy; unless future pregnancy is desired, birth control would be advised.  Follow up with gynecologist for routine post-partum evaluation. Should be done 6 weeks post- delivery.

## 2021-07-05 LAB — CERVICOVAGINAL ANCILLARY ONLY
Bacterial Vaginitis (gardnerella): POSITIVE — AB
Candida Glabrata: NEGATIVE
Candida Vaginitis: NEGATIVE
Chlamydia: NEGATIVE
Comment: NEGATIVE
Comment: NEGATIVE
Comment: NEGATIVE
Comment: NEGATIVE
Comment: NEGATIVE
Comment: NORMAL
Neisseria Gonorrhea: NEGATIVE
Trichomonas: POSITIVE — AB

## 2021-08-08 ENCOUNTER — Emergency Department (HOSPITAL_COMMUNITY)
Admission: EM | Admit: 2021-08-08 | Discharge: 2021-08-08 | Disposition: A | Discharge status: Home / Self Care | Payer: Medicaid Other | Attending: Emergency Medicine | Admitting: Emergency Medicine

## 2021-08-08 DIAGNOSIS — Z20822 Contact with and (suspected) exposure to covid-19: Secondary | ICD-10-CM | POA: Insufficient documentation

## 2021-08-08 DIAGNOSIS — Z7951 Long term (current) use of inhaled steroids: Secondary | ICD-10-CM | POA: Insufficient documentation

## 2021-08-08 DIAGNOSIS — J45909 Unspecified asthma, uncomplicated: Secondary | ICD-10-CM | POA: Insufficient documentation

## 2021-08-08 DIAGNOSIS — R300 Dysuria: Secondary | ICD-10-CM

## 2021-08-08 DIAGNOSIS — N939 Abnormal uterine and vaginal bleeding, unspecified: Secondary | ICD-10-CM

## 2021-08-08 DIAGNOSIS — R112 Nausea with vomiting, unspecified: Secondary | ICD-10-CM | POA: Insufficient documentation

## 2021-08-08 DIAGNOSIS — N760 Acute vaginitis: Secondary | ICD-10-CM | POA: Insufficient documentation

## 2021-08-08 DIAGNOSIS — B9689 Other specified bacterial agents as the cause of diseases classified elsewhere: Secondary | ICD-10-CM

## 2021-08-08 LAB — CBC WITH DIFFERENTIAL/PLATELET
Abs Immature Granulocytes: 0.01 10*3/uL (ref 0.00–0.07)
Basophils Absolute: 0 10*3/uL (ref 0.0–0.1)
Basophils Relative: 0 %
Eosinophils Absolute: 0.1 10*3/uL (ref 0.0–0.5)
Eosinophils Relative: 2 %
HCT: 41.5 % (ref 36.0–46.0)
Hemoglobin: 14 g/dL (ref 12.0–15.0)
Immature Granulocytes: 0 %
Lymphocytes Relative: 50 %
Lymphs Abs: 2.7 10*3/uL (ref 0.7–4.0)
MCH: 32.9 pg (ref 26.0–34.0)
MCHC: 33.7 g/dL (ref 30.0–36.0)
MCV: 97.6 fL (ref 80.0–100.0)
Monocytes Absolute: 0.4 10*3/uL (ref 0.1–1.0)
Monocytes Relative: 8 %
Neutro Abs: 2.1 10*3/uL (ref 1.7–7.7)
Neutrophils Relative %: 40 %
Platelets: 276 10*3/uL (ref 150–400)
RBC: 4.25 MIL/uL (ref 3.87–5.11)
RDW: 12.1 % (ref 11.5–15.5)
WBC: 5.3 10*3/uL (ref 4.0–10.5)
nRBC: 0 % (ref 0.0–0.2)

## 2021-08-08 LAB — WET PREP, GENITAL
Sperm: NONE SEEN
Trich, Wet Prep: NONE SEEN
WBC, Wet Prep HPF POC: <10 (ref <10)
Yeast Wet Prep HPF POC: NONE SEEN

## 2021-08-08 LAB — COMPREHENSIVE METABOLIC PANEL
ALT: 18 U/L (ref 0–44)
AST: 20 U/L (ref 15–41)
Albumin: 4 g/dL (ref 3.5–5.0)
Alkaline Phosphatase: 58 U/L (ref 38–126)
Anion gap: 10 (ref 5–15)
BUN: 9 mg/dL (ref 6–20)
CO2: 24 mmol/L (ref 22–32)
Calcium: 9.4 mg/dL (ref 8.9–10.3)
Chloride: 106 mmol/L (ref 98–111)
Creatinine, Ser: 0.78 mg/dL (ref 0.44–1.00)
GFR, Estimated: >60 mL/min (ref >60)
Glucose, Bld: 90 mg/dL (ref 70–99)
Potassium: 3.7 mmol/L (ref 3.5–5.1)
Sodium: 140 mmol/L (ref 135–145)
Total Bilirubin: 0.5 mg/dL (ref 0.3–1.2)
Total Protein: 7.4 g/dL (ref 6.5–8.1)

## 2021-08-08 LAB — RESP PANEL BY RT-PCR (FLU A&B, COVID) ARPGX2
Influenza A by PCR: NEGATIVE
Influenza B by PCR: NEGATIVE
SARS Coronavirus 2 by RT PCR: NEGATIVE

## 2021-08-08 LAB — URINALYSIS, MICROSCOPIC (REFLEX): RBC / HPF: >50 RBC/hpf (ref 0–5)

## 2021-08-08 LAB — GC/CHLAMYDIA PROBE AMP (~~LOC~~) NOT AT ARMC
Chlamydia: NEGATIVE
Comment: NEGATIVE
Comment: NORMAL
Neisseria Gonorrhea: NEGATIVE

## 2021-08-08 LAB — URINALYSIS, ROUTINE W REFLEX MICROSCOPIC
Glucose, UA: NEGATIVE mg/dL
Ketones, ur: NEGATIVE mg/dL
Leukocytes,Ua: NEGATIVE
Nitrite: POSITIVE — AB
Protein, ur: 100 mg/dL — AB
Specific Gravity, Urine: >1.03 — ABNORMAL HIGH (ref 1.005–1.030)
pH: 5 (ref 5.0–8.0)

## 2021-08-08 LAB — RPR: RPR Ser Ql: NONREACTIVE

## 2021-08-08 LAB — PREGNANCY, URINE: Preg Test, Ur: NEGATIVE

## 2021-08-08 LAB — HIV ANTIBODY (ROUTINE TESTING W REFLEX): HIV Screen 4th Generation wRfx: NONREACTIVE

## 2021-08-08 LAB — LIPASE, BLOOD: Lipase: 29 U/L (ref 11–51)

## 2021-08-08 MED ORDER — LIDOCAINE HCL (PF) 1 % IJ SOLN
1.0000 mL | Freq: Once | INTRAMUSCULAR | Status: AC
Start: 2021-08-08 — End: 2021-08-08
  Administered 2021-08-08: 1 mL
  Filled 2021-08-08: qty 5

## 2021-08-08 MED ORDER — DOXYCYCLINE HYCLATE 100 MG PO CAPS: 100.0000 mg | ORAL_CAPSULE | Freq: Two times a day (BID) | ORAL | 0 refills | Status: AC

## 2021-08-08 MED ORDER — CEFTRIAXONE SODIUM 500 MG IJ SOLR
500.0000 mg | Freq: Once | INTRAMUSCULAR | Status: AC
  Administered 2021-08-08: 500 mg via INTRAMUSCULAR
  Filled 2021-08-08: qty 500

## 2021-08-08 MED ORDER — NITROFURANTOIN MONOHYD MACRO 100 MG PO CAPS: 100.0000 mg | ORAL_CAPSULE | Freq: Two times a day (BID) | ORAL | 0 refills | Status: DC

## 2021-08-08 MED ORDER — ONDANSETRON 4 MG PO TBDP
4.0000 mg | ORAL_TABLET | Freq: Once | ORAL | Status: AC
  Administered 2021-08-08: 4 mg via ORAL
  Filled 2021-08-08: qty 1

## 2021-08-08 MED ORDER — ONDANSETRON 4 MG PO TBDP: 4.0000 mg | ORAL_TABLET | Freq: Three times a day (TID) | ORAL | 0 refills | Status: DC | PRN

## 2021-08-08 MED ORDER — METRONIDAZOLE 0.75 % VA GEL: 1.0000 | Freq: Every day | VAGINAL | 0 refills | Status: AC

## 2021-08-08 NOTE — ED Triage Notes (Signed)
Patient arrives POV, ambulatory to triage. C/o vomiting and diarrhea for 3 days. Also endorsees generalized abd pain.  Patient took peptol bismol few hours prior to arrival. Recently rx antibiotics for UTI.

## 2021-08-08 NOTE — ED Notes (Signed)
Pelvic cart at bedside. 

## 2021-08-08 NOTE — ED Provider Triage Note (Signed)
Emergency Medicine Provider Triage Evaluation Note  [facility] , a 21 y.o. female  was evaluated in triage.  Pt complains of nausea, vomiting, diarrhea, and generalized abdominal pain.  States that symptoms have been present over the last 3 days.  Patient states that emesis looks like stomach contents.  Reports that she was started on antibiotics yesterday for a urinary tract infection.  Additionally patient endorses urinary frequency and vaginal discharge.  Patient reports that vaginal discharge has been present since she was previously diagnosed and treated for trichomonas.  Patient reports delivery in November 2022.  He is not on any forms of birth control.  Sexually active with multiple female partners, states that she does use condoms for penetrative sexual intercourse.  Review of Systems  Positive: Nausea, vomiting, diarrhea, generalized abdominal pain, vaginal bleeding, vaginal discharge, urinary frequency Negative: Hematemesis, coffee-ground emesis, blood in stool, melena, fever, chills, dysuria, vaginal pain  Physical Exam  BP 130/89 (BP Location: Right Arm)    Pulse 85    Temp 98.4 F (36.9 C) (Oral)    Resp 16    SpO2 98%  Gen:   Awake, no distress   Resp:  Normal effort  MSK:   Moves extremities without difficulty  Other:  Abdomen soft, nondistended, minimal diffuse tenderness throughout abdomen.  No guarding or rebound tenderness.  Medical Decision Making  Medically screening exam initiated at 2:44 AM.  Appropriate orders placed.  Taylor Brennan was informed that the remainder of the evaluation will be completed by another provider, this initial triage assessment does not replace that evaluation, and the importance of remaining in the ED until their evaluation is complete.     Loni Beckwith, [place] 08/08/21 [phone]

## 2021-08-08 NOTE — Discharge Instructions (Addendum)
Today you have been tested for gonorrhea and chlamydia.  The test to determine if you have these will take a few days. They will only call you if your tests come back positive, no news is good news. In the result that your tests are positive you have already been treated.  As part of your treatment please take the antibiotic, Doxycycline, every 12 hours until gone. A side effect of this medication includes hypersensitivity to the suns rays - please take measures to protect your skin from the sun while taking this medication.  You also have tests pending for HIV and syphilis.  You should receive a call if these results are positive.  On your discharge information there is also instructions on how to sign up for my chart.  Please complete this so that you can follow the results on your own also.  If positive for HIV or syphilis please got to [place], your primary care doctor, or urgent care to start treatment.  Please do not have sex until you can follow-up with your primary care provider or OB/GYN provider.  After that please make sure that you always use a condom every time you have sex.  If you have any new or concerning symptoms please seek additional medical care and evaluation.     Do not resume breast-feeding until you complete all of your antibiotics and follow-up with your primary care provider or OB/GYN provider.  You may have diarrhea from the antibiotics.  It is very important that you continue to take the antibiotics even if you get diarrhea unless a medical professional tells you that you may stop taking them.  If you stop too early the bacteria you are being treated for will become stronger and you may need different, more powerful antibiotics that have more side effects and worsening diarrhea.  Please stay well hydrated and consider probiotics as they may decrease the severity of your diarrhea.  Please be aware that if you take any hormonal contraception (birth control  pills, nexplanon, the ring, etc) that your birth control will not work while you are taking antibiotics and you need to use back up protection as directed on the birth control medication information insert.

## 2021-08-08 NOTE — ED Provider Notes (Signed)
[facility] Provider Note   CSN: [id number] Arrival date & time: 08/08/21  0226     History  Chief Complaint  Patient presents with   Emesis   Diarrhea   Abdominal Pain    Taylor Brennan is a 21 y.o. female with past medical history of asthma, bipolar 1 disorder, adjustment disorder of adolescence, trichomonas.  Presents to the emergency department with a chief complaint of nausea, vomiting, abdominal pain, vaginal discharge, vaginal bleeding, and dysuria.  Patient reports that her symptoms have been present over the last 2 to 3 days.  Patient unable to state having time she is vomited in the last 24 hours.  Patient describes emesis as stomach contents.  Reports that abdominal pain has been constant over the last 2 to 3 days.  Pain is located throughout her entire abdomen.  Patient describes pain as a "cramping."  Patient states that vaginal discharge has been present over the last week.  States that she was tested and treated for trichomonas.  Patient reports completing all antibiotics.  Patient states that she has had vaginal bleeding over the last 2 to 3 days.  Patient states that she has not had a menstrual period since the birth of her daughter and 05/2021.  Patient has been breast-feeding however has stopped over the last 3 weeks.  Patient reports that bleeding has increased in the last 24 hours.  Patient states that she is having to change her pad/tampon every 30 minutes.    Patient reports that she is sexually active with 2 female partners.  Patient does not always use condoms for penetrative sex.   Emesis Associated symptoms: abdominal pain and diarrhea   Associated symptoms: no chills, no fever and no headaches   Diarrhea Associated symptoms: abdominal pain and vomiting   Associated symptoms: no chills, no fever and no headaches   Abdominal Pain Associated symptoms: diarrhea, dysuria, nausea, vaginal bleeding, vaginal discharge and vomiting    Associated symptoms: no chest pain, no chills, no constipation, no fever, no hematuria and no shortness of breath       Home Medications Prior to Admission medications   Medication Sig Start Date End Date Taking? Authorizing Provider  fluticasone (FLONASE) 50 MCG/ACT nasal spray Place 1 spray into both nostrils 2 (two) times daily. 06/24/20   Volney American, PA-C  ibuprofen (ADVIL) 600 MG tablet Take 1 tablet (600 mg total) by mouth every 6 (six) hours as needed for moderate pain or cramping. 05/19/21   Banga, Bonnee Quin, DO  Prenatal Vit-Fe Fumarate-FA (PRENATAL VITAMIN) 27-0.8 MG TABS Take 1 tablet by mouth daily. 09/20/20   Seabron Spates, CNM  promethazine (PHENERGAN) 6.25 MG/5ML syrup Take 10-20 mLs (12.5-25 mg total) by mouth 4 (four) times daily as needed for nausea or vomiting. 09/20/20 09/20/21  Seabron Spates, CNM      Allergies    Patient has no known allergies.    Review of Systems   Review of Systems  Constitutional:  Negative for chills and fever.  Respiratory:  Negative for shortness of breath.   Cardiovascular:  Negative for chest pain.  Gastrointestinal:  Positive for abdominal pain, diarrhea, nausea and vomiting. Negative for abdominal distention, anal bleeding, blood in stool, constipation and rectal pain.  Genitourinary:  Positive for dysuria, vaginal bleeding and vaginal discharge. Negative for difficulty urinating, frequency, genital sores, hematuria, pelvic pain and urgency.  Musculoskeletal:  Negative for back pain and neck pain.  Skin:  Negative for  color change and rash.  Neurological:  Negative for dizziness, syncope, light-headedness and headaches.  Psychiatric/Behavioral:  Negative for confusion.    Physical Exam Updated Vital Signs BP 130/89 (BP Location: Right Arm)    Pulse 85    Temp 98.4 F (36.9 C) (Oral)    Resp 16    SpO2 98%  Physical Exam Vitals and nursing note reviewed. Exam conducted with a chaperone present (female RN  present).  Constitutional:      General: She is not in acute distress.    Appearance: She is not ill-appearing, toxic-appearing or diaphoretic.  HENT:     Head: Normocephalic.  Eyes:     General: No scleral icterus.       Right eye: No discharge.        Left eye: No discharge.  Cardiovascular:     Rate and Rhythm: Normal rate.  Pulmonary:     Effort: Pulmonary effort is normal.  Abdominal:     General: Abdomen is flat. Bowel sounds are normal. There is no distension. There are no signs of injury.     Palpations: Abdomen is soft. There is no mass or pulsatile mass.     Tenderness: There is no abdominal tenderness. There is no guarding or rebound.     Hernia: There is no hernia in the umbilical area, ventral area, left inguinal area or right inguinal area.  Genitourinary:    Pubic Area: No rash or pubic lice.      Tanner stage (genital): 5.     Labia:        Right: No rash, tenderness, lesion or injury.        Left: No rash, tenderness, lesion or injury.      Vagina: No signs of injury and foreign body. Bleeding present. No vaginal discharge, erythema, tenderness, lesions or prolapsed vaginal walls.     Cervix: Cervical bleeding present. No cervical motion tenderness, discharge, friability, lesion, erythema or eversion.     Uterus: Not enlarged and not tender.      Adnexa: Right adnexa normal and left adnexa normal.     Comments: Bleeding noted to vaginal vault.  No cervical motion tenderness. Lymphadenopathy:     Lower Body: No right inguinal adenopathy. No left inguinal adenopathy.  Skin:    General: Skin is warm and dry.  Neurological:     General: No focal deficit present.     Mental Status: She is alert.  Psychiatric:        Behavior: Behavior is cooperative.    ED Results / Procedures / Treatments   Labs (all labs ordered are listed, but only abnormal results are displayed) Labs Reviewed  WET PREP, GENITAL - Abnormal; Notable for the following components:      Result  Value   Clue Cells Wet Prep HPF POC PRESENT (*)    All other components within normal limits  URINALYSIS, ROUTINE W REFLEX MICROSCOPIC - Abnormal; Notable for the following components:   Color, Urine AMBER (*)    APPearance CLOUDY (*)    Specific Gravity, Urine >1.030 (*)    Hgb urine dipstick LARGE (*)    Bilirubin Urine MODERATE (*)    Protein, ur 100 (*)    Nitrite POSITIVE (*)    All other components within normal limits  URINALYSIS, MICROSCOPIC (REFLEX) - Abnormal; Notable for the following components:   Bacteria, UA MANY (*)    All other components within normal limits  RESP PANEL BY RT-PCR (FLU A&B, COVID)  ARPGX2  URINE CULTURE  PREGNANCY, URINE  COMPREHENSIVE METABOLIC PANEL  CBC WITH DIFFERENTIAL/PLATELET  LIPASE, BLOOD  RPR  HIV ANTIBODY (ROUTINE TESTING W REFLEX)  GC/CHLAMYDIA PROBE AMP ([place]) NOT AT [facility]    EKG None  Radiology No results found.  Procedures Procedures    Medications Ordered in ED Medications  ondansetron (ZOFRAN-ODT) disintegrating tablet 4 mg (4 mg Oral Given 08/08/21 0253)  cefTRIAXone (ROCEPHIN) injection 500 mg (500 mg Intramuscular Given 08/08/21 0616)  lidocaine (PF) (XYLOCAINE) 1 % injection 1 mL (1 mL Other Given 08/08/21 [id number])    ED Course/ Medical Decision Making/ A&P                           Medical Decision Making Amount and/or Complexity of Data Reviewed Labs: ordered.  Risk Prescription drug management.   Alert 21 year old female no acute stress, nontoxic-appearing.  Presents to the emergency department with a chief complaint of nausea, vomiting, abdominal pain, vaginal discharge, dysuria and vaginal bleeding.  See HPI for past medical history that complicate her care.  Information was obtained from patient.  Medical records were reviewed including previous lab results and provider notes.  Lab work was independently reviewed by myself.  Pertinent findings include: -Urine pregnancy test negative - Lipase, CMP and  CBC unremarkable - Urinalysis shows bacteria many, squamous epithelial 21-50, WBC 21-50, RBC greater than 50, nitrite positive, leukocyte negative - Wet prep shows clue cells  Abdomen soft, nondistended, nontender.  Low suspicion for acute intra-abdominal process at this time with unremarkable CBC, CMP, lipase and no tenderness on abdominal exam.  Patient reports that she has not had a menstrual period since her pregnancy in November 2022.  Patient reports that she was previously breast-feeding however stopped approximately 3 weeks prior.  Suspect that this is patient's menstrual period.  Patient has no signs of anemia on lab work.  We will have her follow-up with OB/GYN provider in outpatient setting.  Patient reports that she recently had trichomonas infection which she received antibiotics for.  With dysuria concern for possible STI.  Will treat empirically for gonorrhea and chlamydia at this time.  Discussed pending syphilis and HIV test results and importance of treatment if contacted with positive results.  Discussed refraining from sexual activity for minimum of 2 weeks and then after seen by PCP or OB/GYN provider.  Additionally wet prep shows clue cells consistent with bacterial vaginosis.  Patient requests metronidazole gel for treatment.  Urinalysis concerning for possible urinary tract infection.  Will send urine for culture and treat with Macrobid.  Discussed results, findings, treatment and follow up. Patient advised of return precautions. Patient verbalized understanding and agreed with plan.         Final Clinical Impression(s) / ED Diagnoses Final diagnoses:  Bacterial vaginosis  Nausea and vomiting, unspecified vomiting type  Dysuria  Vaginal bleeding    Rx / DC Orders ED Discharge Orders          Ordered    ondansetron (ZOFRAN-ODT) 4 MG disintegrating tablet  Every 8 hours PRN        08/08/21 0617    doxycycline (VIBRAMYCIN) 100 MG capsule  2 times daily         08/08/21 0617    metroNIDAZOLE (METROGEL) 0.75 % vaginal gel  Daily at bedtime        08/08/21 0617    nitrofurantoin, macrocrystal-monohydrate, (MACROBID) 100 MG capsule  2 times daily  08/08/21 0620              Loni Beckwith, PA-C 08/08/21 [id number]    Maudie Flakes, MD 08/09/21 [phone]

## 2021-08-10 LAB — URINE CULTURE: Culture: >=100000 — AB

## 2021-08-11 ENCOUNTER — Telehealth (HOSPITAL_BASED_OUTPATIENT_CLINIC_OR_DEPARTMENT_OTHER): Payer: Self-pay | Admitting: *Deleted

## 2021-08-11 NOTE — Telephone Encounter (Signed)
Post ED Visit - Positive Culture Follow-up  Culture report reviewed by antimicrobial stewardship pharmacist: [facility] []  , Pharm.D. []  Enzo Bi, Pharm.D., BCPS AQ-ID []  , Pharm.D., BCPS []  Celedonio Miyamoto, Pharm.D., BCPS []  [place], Garvin Fila.D., BCPS, AAHIVP []  , Pharm.D., BCPS, AAHIVP []  Georgina Pillion, PharmD, BCPS []  , PharmD, BCPS []  [place], PharmD, BCPS []  [place], PharmD []  , PharmD, BCPS [x]  Estella Husk, PharmD  Pharmacy Team []  Lysle Pearl, PharmD []  , PharmD []  Phillips Climes, PharmD []  , Rph []  Agapito Games) , PharmD []  Verlan Friends, PharmD []  , PharmD []  Mervyn Gay, PharmD []  , PharmD []  Vinnie Level, PharmD []  W[facility], PharmD []  , PharmD []  Len Childs, PharmD   Positive urine culture Treated with Doxycycline Hyclate, Nitrofurantoin [place] Macro, organism sensitive to the same and no further patient follow-up is required at this time.  08/11/2021, 10:39 AM

## 2021-09-14 IMAGING — US US PELVIS COMPLETE TRANSABD/TRANSVAG W DUPLEX
1 series · 13 of 25 positions shown · non-contrast
Comparison: 10/19/2013 CT abdomen/pelvis.

CLINICAL DATA: 19-year-old female with right pelvic/lower abdominal
pain for 1 month. History of chlamydia. LMP 07/17/2020.

EXAM:
TRANSABDOMINAL AND TRANSVAGINAL ULTRASOUND OF PELVIS
DOPPLER ULTRASOUND OF OVARIES
TECHNIQUE: Both transabdominal and transvaginal ultrasound examinations of the
pelvis were performed. Transabdominal technique was performed for
global imaging of the pelvis including uterus, ovaries, adnexal
regions, and pelvic cul-de-sac.
It was necessary to proceed with endovaginal exam following the
transabdominal exam to visualize the endometrium and adnexa. Color
and duplex Doppler ultrasound was utilized to evaluate blood flow to
the ovaries.

[Series 1: us pelvic complete w transvaginal and torsion righ · 120 acquisitions, 13 frames shown]
[im 1/120]
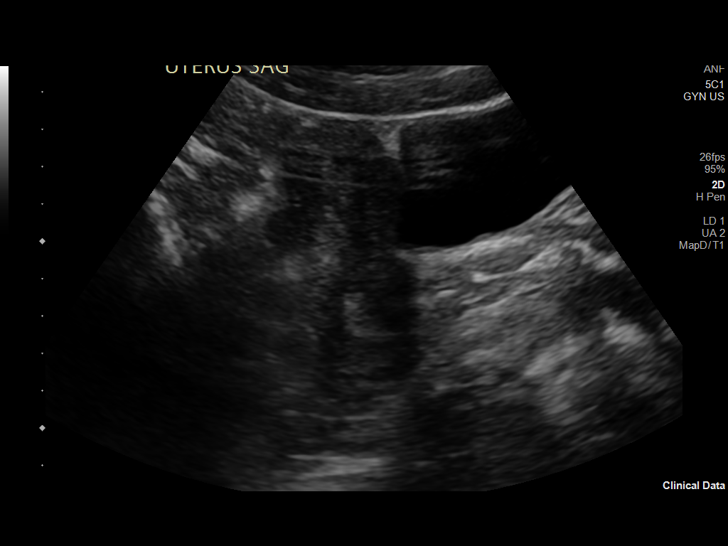
[im 10/120]
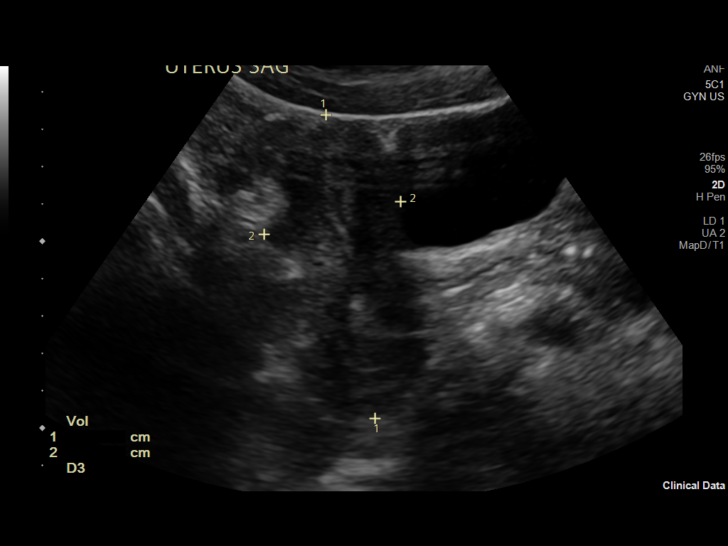
[im 20/120]
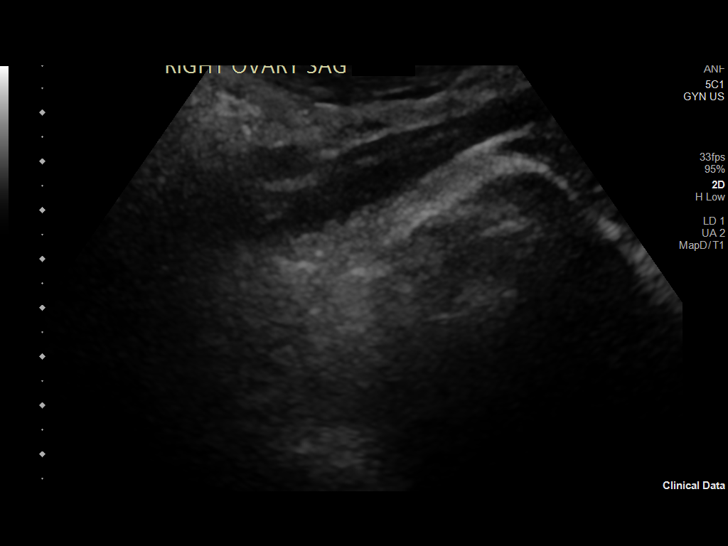
[im 30/120]
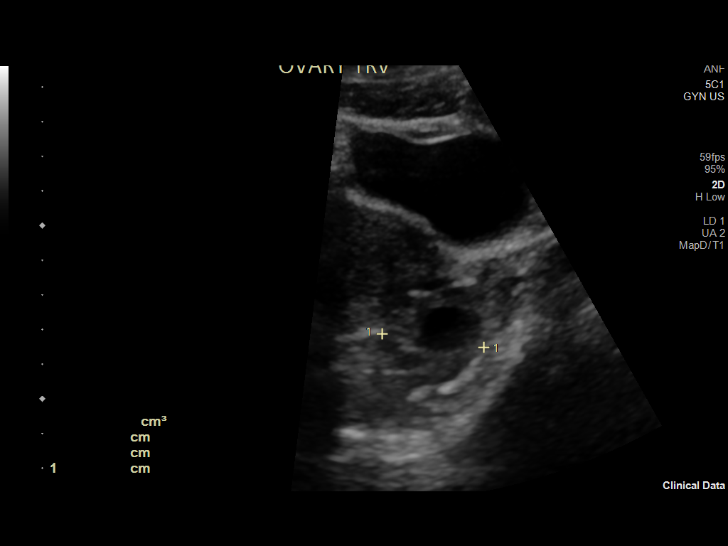
[im 40/120]
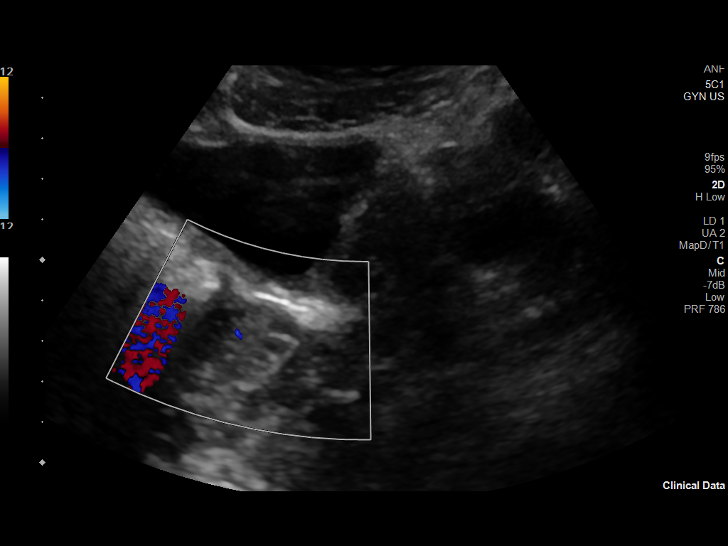
[im 50/120]
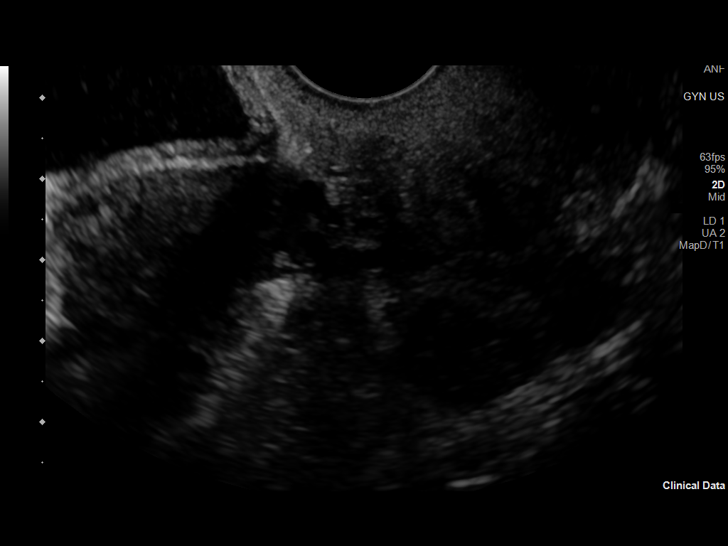
[im 60/120]
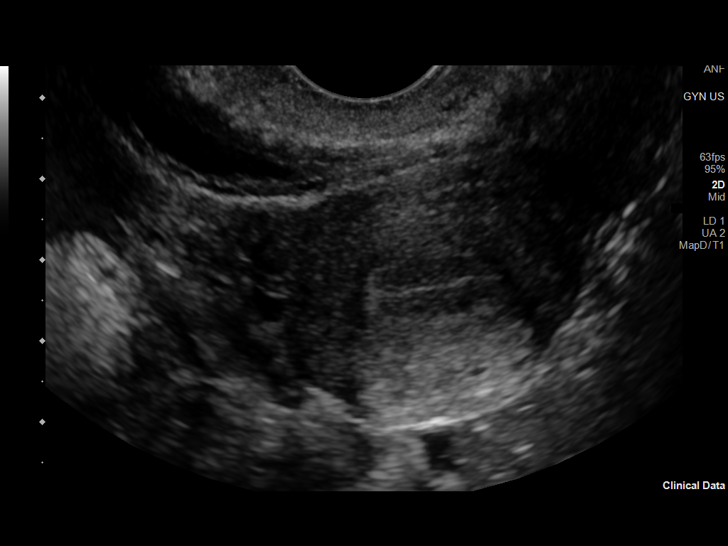
[im 70/120]
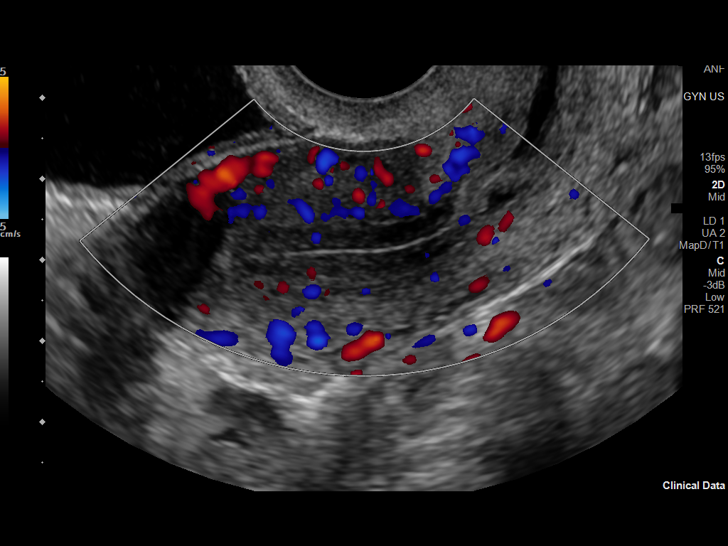
[im 80/120]
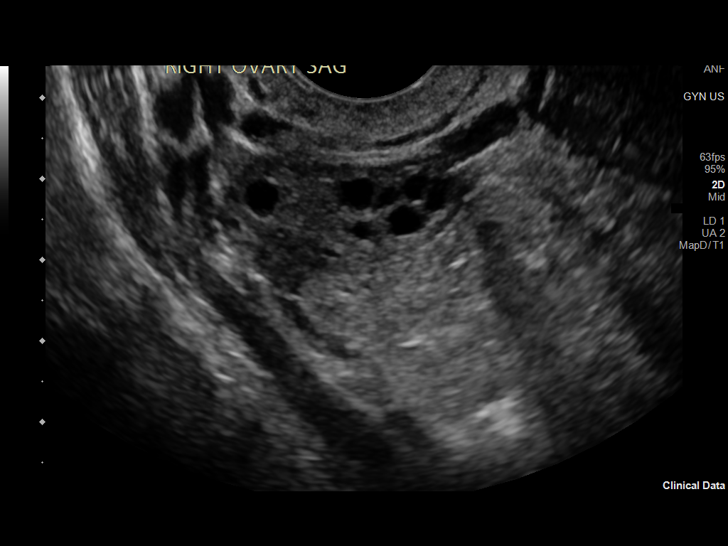
[im 90/120]
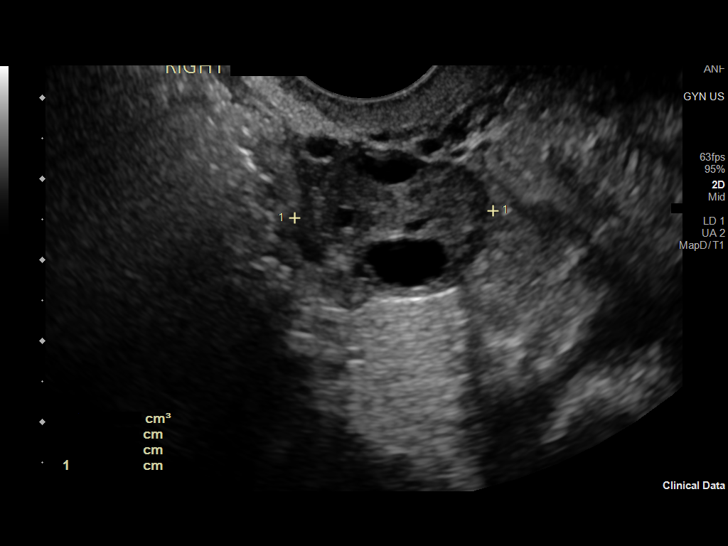
[im 100/120]
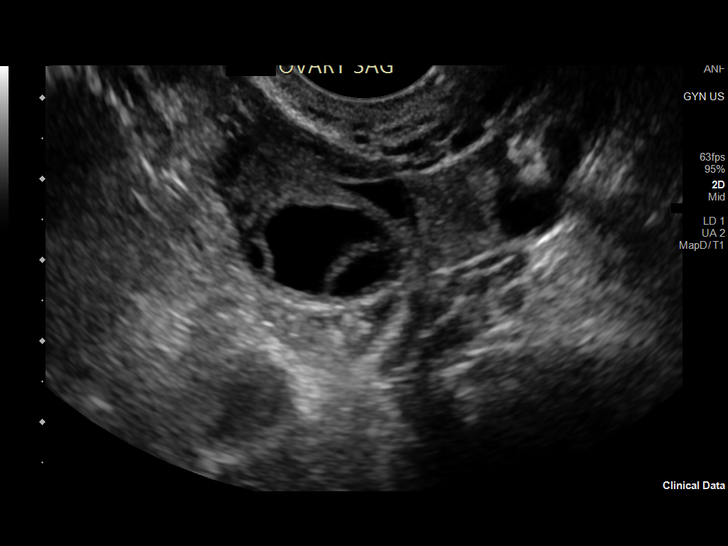
[im 110/120]
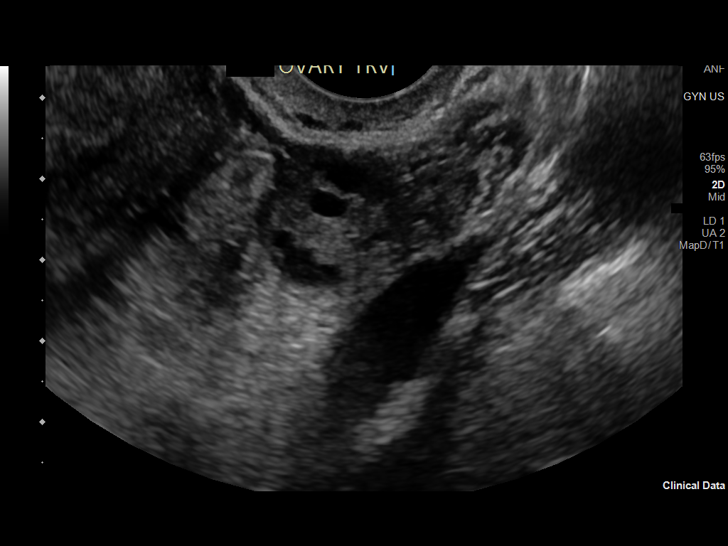
[im 120/120]
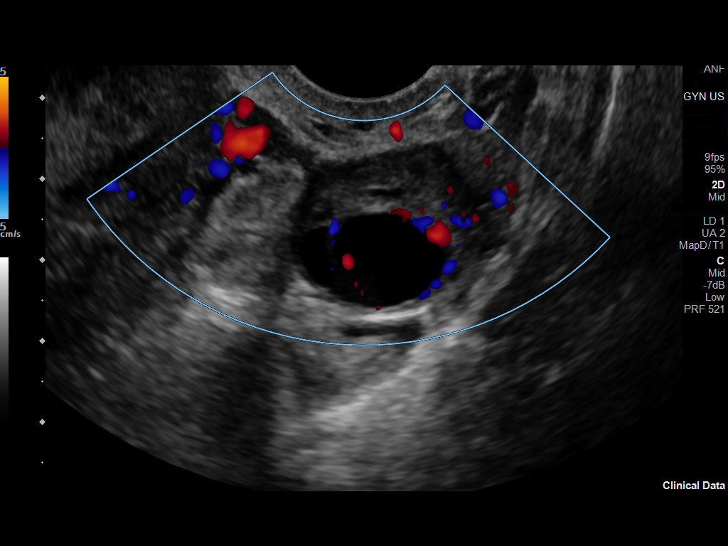

[13 of 25 positions shown; findings below may reference images not displayed]

FINDINGS: Uterus

Measurements: 7.0 x 3.0 x 4.3 cm = volume: 48 mL. Anteverted uterus
is normal in size and configuration, with no uterine fibroids or
other myometrial abnormalities.

Endometrium

Thickness: 8 mm. No endometrial cavity fluid or focal endometrial
mass.

Right ovary

Measurements: 3.5 x 1.7 x 2.5 cm = volume: 7.4 mL. Normal
appearance/no adnexal mass.

Left ovary

Measurements: 4.2 x 2.1 x 2.4 cm = volume: 10.9 mL. Normal
appearance/no adnexal mass. Dominant 1.6 cm follicle.

Pulsed Doppler evaluation of both ovaries demonstrates normal
low-resistance arterial and venous waveforms.

Other findings

No abnormal free fluid.
IMPRESSION: Normal pelvic sonogram.  No evidence of adnexal torsion.

## 2021-11-14 ENCOUNTER — Encounter: Payer: Self-pay | Admitting: Emergency Medicine

## 2021-11-14 ENCOUNTER — Ambulatory Visit
Admission: EM | Admit: 2021-11-14 | Discharge: 2021-11-14 | Disposition: A | Discharge status: Home / Self Care | Payer: Medicaid Other | Attending: Student | Admitting: Student

## 2021-11-14 DIAGNOSIS — Z3A01 Less than 8 weeks gestation of pregnancy: Secondary | ICD-10-CM | POA: Diagnosis not present

## 2021-11-14 DIAGNOSIS — O219 Vomiting of pregnancy, unspecified: Secondary | ICD-10-CM | POA: Diagnosis not present

## 2021-11-14 MED ORDER — DOXYLAMINE-PYRIDOXINE 10-10 MG PO TBEC
2.0000 | DELAYED_RELEASE_TABLET | Freq: Every day | ORAL | 1 refills | Status: DC
Start: 2021-11-14 — End: 2023-04-29

## 2021-11-14 NOTE — Discharge Instructions (Addendum)
-  Start Doxylamine-Pyridoxine (DICLEGIS) for nausea and vomiting.  ?-Avoid pepto bismal, imodium, etc ?-Drink plenty of fluids and eat a bland diet  ?
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
## 2021-11-14 NOTE — ED Triage Notes (Signed)
Patient c/o vomiting x 1 day, pt is currently pregnant.  Diarrhea as well.  Patient has taken Pepto. ?

## 2023-01-15 ENCOUNTER — Emergency Department (HOSPITAL_BASED_OUTPATIENT_CLINIC_OR_DEPARTMENT_OTHER)
Admission: EM | Admit: 2023-01-15 | Discharge: 2023-01-15 | Disposition: A | Discharge status: Home / Self Care | Payer: MEDICAID | Attending: Emergency Medicine | Admitting: Emergency Medicine

## 2023-01-15 ENCOUNTER — Encounter (HOSPITAL_BASED_OUTPATIENT_CLINIC_OR_DEPARTMENT_OTHER): Payer: Self-pay

## 2023-01-15 ENCOUNTER — Other Ambulatory Visit: Payer: Self-pay

## 2023-01-15 DIAGNOSIS — J039 Acute tonsillitis, unspecified: Secondary | ICD-10-CM | POA: Diagnosis not present

## 2023-01-15 DIAGNOSIS — B9789 Other viral agents as the cause of diseases classified elsewhere: Secondary | ICD-10-CM

## 2023-01-15 DIAGNOSIS — J029 Acute pharyngitis, unspecified: Secondary | ICD-10-CM | POA: Diagnosis present

## 2023-01-15 LAB — GROUP A STREP BY PCR: Group A Strep by PCR: NOT DETECTED

## 2023-01-15 MED ORDER — DEXAMETHASONE 4 MG PO TABS
4.0000 mg | ORAL_TABLET | Freq: Once | ORAL | Status: AC
  Administered 2023-01-15: 4 mg via ORAL
  Filled 2023-01-15: qty 1

## 2023-01-15 NOTE — Discharge Instructions (Signed)
Your work-up in the ER today was reassuring for acute findings. You were swabbed for strep which was negative.  Given this, your symptoms are likely due to tonsillitis likely viral etiology.  Therefore, no antibiotics are indicated. I recommend that you get plenty of rest and focus on symptomatic relief which includes Cepacol throat lozenges for sore throat, Mucinex D (orange box) which you can get from behind the counter at your local pharmacy for congestion, and tylenol/ibuprofen as needed for fevers and bodyaches.  I did give you 1 round of a long-acting steroid which should help your symptoms over the next few days. I also recommend:  Increased fluid intake. Sports drinks offer valuable electrolytes, sugars, and fluids.  Breathing heated mist or steam (vaporizer or shower).  Eating chicken soup or other clear broths, and maintaining good nutrition.   Increasing usage of your inhaler if you have asthma.  Return to work when your temperature has returned to normal.  Gargle warm salt water and spit it out for sore throat. Take benadryl or Zyrtec to decrease sinus secretions.  Follow Up: Follow up with your primary care doctor in 5-7 days for recheck of ongoing symptoms.  Return to emergency department for emergent changing or worsening of symptoms.

## 2023-01-15 NOTE — ED Provider Notes (Signed)
[place] EMERGENCY DEPARTMENT AT [facility] Provider Note   CSN: [id number] Arrival date & time: 01/15/23  1859     History  Chief Complaint  Patient presents with   Sore Throat    Taylor Brennan is a 22 y.o. female.  Patient with noncontributory past medical history presents today with complaints of sore throat.  She states that same has been ongoing for the last 3 days.  She has not tried anything for her symptoms.  She denies any known sick contacts.  No fevers or chills.  She is able to swallow with some discomfort.  Denies any cough or congestion, chest pain, or shortness of breath.  No nausea, vomiting, or abdominal pain.  The history is provided by the patient. No language interpreter was used.  Sore Throat       Home Medications Prior to Admission medications   Medication Sig Start Date End Date Taking? Authorizing Provider  Doxylamine-Pyridoxine (DICLEGIS) 10-10 MG TBEC Take 2 tablets by mouth daily. Start with 2 delayed-release tablets by mouth on a daily basis at bedtime If symptoms not adequately controlled, increase dose to 4 tablets each day (1 tab in AM, 1 tab mid-afternoon, and 2 tabs at bedtime) 11/14/21   Rhys Martini, PA-C  Prenatal Vit-Fe Fumarate-FA (PRENATAL VITAMIN) 27-0.8 MG TABS Take 1 tablet by mouth daily. 09/20/20   Aviva Signs, CNM      Allergies    Patient has no known allergies.    Review of Systems   Review of Systems  HENT:  Positive for sore throat.   All other systems reviewed and are negative.   Physical Exam Updated Vital Signs BP 118/87 (BP Location: Right Arm)   Pulse 99   Temp 98.3 F (36.8 C)   Resp 18   Ht 5\' 1"  (1.549 m)   Wt 81.2 kg   LMP 12/26/2022   SpO2 97%   BMI 33.82 kg/m  Physical Exam Vitals and nursing note reviewed.  Constitutional:      General: She is not in acute distress.    Appearance: Normal appearance. She is normal weight. She is not ill-appearing, toxic-appearing or  diaphoretic.  HENT:     Head: Normocephalic and atraumatic.     Mouth/Throat:     Mouth: Mucous membranes are moist.     Pharynx: Uvula midline. No uvula swelling.     Tonsils: Tonsillar exudate present. No tonsillar abscesses. 2+ on the right. 2+ on the left.  Eyes:     Conjunctiva/sclera: Conjunctivae normal.     Pupils: Pupils are equal, round, and reactive to light.  Cardiovascular:     Rate and Rhythm: Normal rate and regular rhythm.     Heart sounds: Normal heart sounds.  Pulmonary:     Effort: Pulmonary effort is normal. No respiratory distress.     Breath sounds: Normal breath sounds.  Abdominal:     Palpations: Abdomen is soft.     Tenderness: There is no abdominal tenderness.  Musculoskeletal:        General: Normal range of motion.     Cervical back: Normal range of motion and neck supple.  Skin:    General: Skin is warm and dry.  Neurological:     General: No focal deficit present.     Mental Status: She is alert.  Psychiatric:        Mood and Affect: Mood normal.        Behavior: Behavior normal.  ED Results / Procedures / Treatments   Labs (all labs ordered are listed, but only abnormal results are displayed) Labs Reviewed  GROUP A STREP BY PCR    EKG None  Radiology No results found.  Procedures Procedures    Medications Ordered in ED Medications  dexamethasone (DECADRON) tablet 4 mg (4 mg Oral Given 01/15/23 2059)    ED Course/ Medical Decision Making/ A&P                             Medical Decision Making Risk Prescription drug management.   Patient presents today with complaints of sore throat x 3 days.  She is afebrile, nontoxic-appearing, and in no acute distress with reassuring vital signs.  Physical exam reveals mild tonsillar swelling bilaterally with exudate.  Presentation not concerning for PTA or RPA, she is able to swallow without issue.  No trismus or uvula deviation.  Strep swab obtained which is negative.  Discussed with  patient.  Symptoms likely due to tonsillitis, likely viral etiology.  Patient given Decadron for same.  Supportive care recommendations given as well. Evaluation and diagnostic testing in the emergency department does not suggest an emergent condition requiring admission or immediate intervention beyond what has been performed at this time.  Plan for discharge with close PCP follow-up.  Patient is understanding and amenable with plan, educated on red flag symptoms that would prompt immediate return.  Patient discharged in stable condition.  Final Clinical Impression(s) / ED Diagnoses Final diagnoses:  Viral tonsillitis    Rx / DC Orders ED Discharge Orders     None     An After Visit Summary was printed and given to the patient.     Vear Clock 01/15/23 2113    Cathren Laine, MD 01/15/23 [phone]

## 2023-01-15 NOTE — ED Triage Notes (Signed)
Complaining of a sore throat for 3-4 days now. Hurts to swallow.

## 2023-04-29 ENCOUNTER — Other Ambulatory Visit: Payer: Self-pay

## 2023-04-29 ENCOUNTER — Emergency Department (HOSPITAL_COMMUNITY)
Admission: EM | Admit: 2023-04-29 | Discharge: 2023-04-29 | Disposition: A | Discharge status: Home / Self Care | Payer: MEDICAID | Attending: Emergency Medicine | Admitting: Emergency Medicine

## 2023-04-29 ENCOUNTER — Encounter (HOSPITAL_COMMUNITY): Payer: Self-pay

## 2023-04-29 DIAGNOSIS — R309 Painful micturition, unspecified: Secondary | ICD-10-CM | POA: Diagnosis not present

## 2023-04-29 DIAGNOSIS — Z3A01 Less than 8 weeks gestation of pregnancy: Secondary | ICD-10-CM | POA: Insufficient documentation

## 2023-04-29 DIAGNOSIS — R197 Diarrhea, unspecified: Secondary | ICD-10-CM | POA: Insufficient documentation

## 2023-04-29 DIAGNOSIS — O219 Vomiting of pregnancy, unspecified: Secondary | ICD-10-CM | POA: Diagnosis present

## 2023-04-29 DIAGNOSIS — Z3201 Encounter for pregnancy test, result positive: Secondary | ICD-10-CM

## 2023-04-29 LAB — PREGNANCY, URINE: Preg Test, Ur: POSITIVE — AB

## 2023-04-29 LAB — URINALYSIS, ROUTINE W REFLEX MICROSCOPIC
Bilirubin Urine: NEGATIVE
Glucose, UA: NEGATIVE mg/dL
Hgb urine dipstick: NEGATIVE
Ketones, ur: 20 mg/dL — AB
Leukocytes,Ua: NEGATIVE
Nitrite: NEGATIVE
Protein, ur: NEGATIVE mg/dL
Specific Gravity, Urine: 1.027 (ref 1.005–1.030)
pH: 5 (ref 5.0–8.0)

## 2023-04-29 MED ORDER — PRENATAL VITAMIN 27-0.8 MG PO TABS: 1.0000 | ORAL_TABLET | Freq: Every day | ORAL | 12 refills | Status: AC

## 2023-04-29 MED ORDER — DOXYLAMINE-PYRIDOXINE 10-10 MG PO TBEC: 2.0000 | DELAYED_RELEASE_TABLET | Freq: Every day | ORAL | 1 refills | Status: DC

## 2023-04-29 NOTE — ED Provider Notes (Signed)
[place] EMERGENCY DEPARTMENT AT [facility] Provider Note   CSN: [id number] Arrival date & time: 04/29/23  1700    History  Chief Complaint  Patient presents with   Nausea   Diarrhea   Possible Pregnancy    Taylor Brennan is a 22 y.o. female here for evaluation of nausea and concern for pregnancy.  She has had intermittent nausea over the last 2 weeks.  1 episode of NB emesis as well as loose stool today.  States she is 2 weeks late for her menstrual cycle.  She has 2 months postpartum.  She denies any headache, chest pain, shortness of breath abdominal pain, pelvic pain, vaginal discharge. Does note she some intermittent burning with urination.  No flank pain.  No hematuria, frequency or urgency.  She states she took a Programme [employer] broadcasting/film/video" pregnancy test 2 weeks ago which was faintly positive she has not retested.  Would be approx 6 w preg by dates LMP 03/18/23  HPI     Home Medications Prior to Admission medications   Medication Sig Start Date End Date Taking? Authorizing Provider  Doxylamine-Pyridoxine (DICLEGIS) 10-10 MG TBEC Take 2 tablets by mouth daily. Start with 2 delayed-release tablets by mouth on a daily basis at bedtime If symptoms not adequately controlled, increase dose to 4 tablets each day (1 tab in AM, 1 tab mid-afternoon, and 2 tabs at bedtime) 04/29/23   Denzel Etienne A, PA-C  Prenatal Vit-Fe Fumarate-FA (PRENATAL VITAMIN) 27-0.8 MG TABS Take 1 tablet by mouth daily. 04/29/23   Tyrena Gohr A, PA-C      Allergies    Patient has no known allergies.    Review of Systems   Review of Systems  Constitutional: Negative.   HENT: Negative.    Respiratory: Negative.    Cardiovascular: Negative.   Gastrointestinal:  Positive for diarrhea, nausea and vomiting. Negative for abdominal distention, abdominal pain, anal bleeding, blood in stool, constipation and rectal pain.  Genitourinary: Negative.   Musculoskeletal: Negative.   Skin: Negative.    Neurological: Negative.   All other systems reviewed and are negative.   Physical Exam Updated Vital Signs BP 132/64 (BP Location: Left Arm)   Pulse 72   Temp 98.3 F (36.8 C) (Oral)   Resp 16   Ht 5\' 1"  (1.549 m)   Wt 79.4 kg   LMP 12/26/2022   SpO2 99%   BMI 33.07 kg/m  Physical Exam Vitals and nursing note reviewed.  Constitutional:      General: She is not in acute distress.    Appearance: She is well-developed. She is not ill-appearing, toxic-appearing or diaphoretic.  HENT:     Head: Normocephalic and atraumatic.     Nose: Nose normal.     Mouth/Throat:     Mouth: Mucous membranes are moist.  Eyes:     Pupils: Pupils are equal, round, and reactive to light.  Cardiovascular:     Rate and Rhythm: Normal rate.     Pulses: Normal pulses.     Heart sounds: Normal heart sounds.  Pulmonary:     Effort: Pulmonary effort is normal. No respiratory distress.     Breath sounds: Normal breath sounds.  Abdominal:     General: Bowel sounds are normal. There is no distension.     Palpations: Abdomen is soft.     Tenderness: There is no abdominal tenderness. There is no right CVA tenderness, left CVA tenderness, guarding or rebound.  Musculoskeletal:  General: Normal range of motion.     Cervical back: Normal range of motion.  Skin:    General: Skin is warm and dry.     Capillary Refill: Capillary refill takes less than 2 seconds.  Neurological:     General: No focal deficit present.     Mental Status: She is alert.  Psychiatric:        Mood and Affect: Mood normal.    ED Results / Procedures / Treatments   Labs (all labs ordered are listed, but only abnormal results are displayed) Labs Reviewed  PREGNANCY, URINE - Abnormal; Notable for the following components:      Result Value   Preg Test, Ur POSITIVE (*)    All other components within normal limits  URINALYSIS, ROUTINE W REFLEX MICROSCOPIC - Abnormal; Notable for the following components:   Ketones, ur  20 (*)    All other components within normal limits    EKG None  Radiology No results found.  Procedures Procedures    Medications Ordered in ED Medications - No data to display  ED Course/ Medical Decision Making/ A&P   22 year old here for evaluation of nausea concern for pregnancy.  Took a home test 2 weeks ago which was "slightly" positive." LMP 2 weeks late, Sept 11.  Some nausea, 1 episode of NBNB emesis today.  No abdominal pain, vaginal bleeding, fluid leakage. `10 months PP. Previously followed with [facility] in [place].  1 episode of loose stool today without any blood.  She has no concerns for any STDs.  Heart lungs are clear.  Abdomen soft nontender.  She appears clinically well-hydrated.  Will plan on labs and reassess  Labs personally viewed and interpreted:  UA negative for infection Pregnancy test positive  Discussed results with patient and family in room which she gives permission to discuss results with her.  Will have her start on a prenatal, medications for her nausea however establish care back with her OB/GYN who she saw for her prior pregnancy 2 months ago  Patient is nontoxic, nonseptic appearing, in no apparent distress.  Patient's pain and other symptoms adequately managed in emergency department.  Fluid bolus given.  Labs, imaging and vitals reviewed.  Patient does not meet the SIRS or Sepsis criteria.  On repeat exam patient does not have a surgical abdomin and there are no peritoneal signs.  No indication of appendicitis, bowel obstruction, bowel perforation, cholecystitis, diverticulitis, PID or ectopic pregnancy.  Patient discharged home with symptomatic treatment and given strict instructions for follow-up with their primary care physician.  I have also discussed reasons to return immediately to the ER.  Patient expresses understanding and agrees with plan.                                  Medical Decision Making Amount and/or Complexity of Data  Reviewed Independent Historian:     Details: Friend in room. Patient gives permission to discuss  External Data Reviewed: labs and notes. Labs: ordered. Decision-making details documented in ED Course.  Risk OTC drugs. Diagnosis or treatment significantly limited by social determinants of health.          Final Clinical Impression(s) / ED Diagnoses Final diagnoses:  Positive pregnancy test  Nausea and vomiting in pregnancy    Rx / DC Orders ED Discharge Orders          Ordered    Doxylamine-Pyridoxine (  DICLEGIS) 10-10 MG TBEC  Daily        04/29/23 1807    Prenatal Vit-Fe Fumarate-FA (PRENATAL VITAMIN) 27-0.8 MG TABS  Daily        04/29/23 1807              Maysen Sudol A, PA-C 04/29/23 1808    Pollyann Savoy, MD 04/29/23 2243

## 2023-04-29 NOTE — Discharge Instructions (Addendum)
Pregnancy test here was positive.  Make sure to establish care again with [facility] that you saw for your previous pregnancy.  I have written you for a prenatal as well as Diclegis for your nausea.  Recommend a bland diet  Return for new or worsening symptoms

## 2023-04-29 NOTE — ED Triage Notes (Signed)
Patient is here for evaluation of nausea X 2 weeks, diarrhea today and possible pregnancy, as she is late for her menstrual cycle for about 2 weeks.

## 2023-05-08 ENCOUNTER — Other Ambulatory Visit: Payer: Self-pay

## 2023-05-08 ENCOUNTER — Emergency Department (HOSPITAL_COMMUNITY)
Admission: EM | Admit: 2023-05-08 | Discharge: 2023-05-08 | Disposition: A | Discharge status: Home / Self Care | Payer: MEDICAID | Attending: Emergency Medicine | Admitting: Emergency Medicine

## 2023-05-08 ENCOUNTER — Encounter (HOSPITAL_COMMUNITY): Payer: Self-pay | Admitting: Emergency Medicine

## 2023-05-08 DIAGNOSIS — J029 Acute pharyngitis, unspecified: Secondary | ICD-10-CM | POA: Diagnosis present

## 2023-05-08 DIAGNOSIS — N760 Acute vaginitis: Secondary | ICD-10-CM | POA: Diagnosis not present

## 2023-05-08 DIAGNOSIS — B3731 Acute candidiasis of vulva and vagina: Secondary | ICD-10-CM

## 2023-05-08 LAB — URINALYSIS, ROUTINE W REFLEX MICROSCOPIC
Bilirubin Urine: NEGATIVE
Glucose, UA: NEGATIVE mg/dL
Hgb urine dipstick: NEGATIVE
Ketones, ur: 5 mg/dL — AB
Leukocytes,Ua: NEGATIVE
Nitrite: NEGATIVE
Protein, ur: NEGATIVE mg/dL
Specific Gravity, Urine: 1.026 (ref 1.005–1.030)
pH: 6 (ref 5.0–8.0)

## 2023-05-08 LAB — WET PREP, GENITAL
Clue Cells Wet Prep HPF POC: NONE SEEN
Sperm: NONE SEEN
Trich, Wet Prep: NONE SEEN
WBC, Wet Prep HPF POC: <10 (ref <10)
Yeast Wet Prep HPF POC: NONE SEEN

## 2023-05-08 LAB — GROUP A STREP BY PCR: Group A Strep by PCR: NOT DETECTED

## 2023-05-08 MED ORDER — CLOTRIMAZOLE 3 2 % VA CREA: 1.0000 | TOPICAL_CREAM | Freq: Every day | VAGINAL | 0 refills | Status: DC

## 2023-05-08 MED ORDER — KETOCONAZOLE 2 % EX CREA: 1.0000 | TOPICAL_CREAM | Freq: Every day | CUTANEOUS | 0 refills | Status: DC

## 2023-05-08 NOTE — ED Provider Notes (Signed)
[place] EMERGENCY DEPARTMENT AT [facility] Provider Note   CSN: [id number] Arrival date & time: 05/08/23  1411     History  Chief Complaint  Patient presents with  . Sore Throat    Taylor Brennan is a 22 y.o. female with a past medical history of bipolar disorder who presents emergency department for evaluation of groin rash and sore throat.  She is G3, P2 and is estimated [redacted] weeks pregnant.  She has not had any obstetric care yet but is taking a prenatal vitamin.  She reports that she went earlier to an urgent care and they told her she could only have 1 complaint so evaluated her for her sore throat and then would not give her medications.  Patient also expresses that she has had some fell/fishy odor from her vagina and she is concerned that she has BV.  She has had this recurrent in the past.  She has no other complaints at this time.   Sore Throat       Home Medications Prior to Admission medications   Medication Sig Start Date End Date Taking? Authorizing Provider  Doxylamine-Pyridoxine (DICLEGIS) 10-10 MG TBEC Take 2 tablets by mouth daily. Start with 2 delayed-release tablets by mouth on a daily basis at bedtime If symptoms not adequately controlled, increase dose to 4 tablets each day (1 tab in AM, 1 tab mid-afternoon, and 2 tabs at bedtime) 04/29/23   Henderly, Britni A, PA-C  Prenatal Vit-Fe Fumarate-FA (PRENATAL VITAMIN) 27-0.8 MG TABS Take 1 tablet by mouth daily. 04/29/23   Henderly, Britni A, PA-C      Allergies    Patient has no known allergies.    Review of Systems   Review of Systems  Physical Exam Updated Vital Signs BP 135/89   Pulse 76   Temp 98.8 F (37.1 C) (Oral)   Resp 16   LMP 12/26/2022   SpO2 100%  Physical Exam Vitals and nursing note reviewed.  Constitutional:      General: She is not in acute distress.    Appearance: She is well-developed. She is not diaphoretic.  HENT:     Head: Normocephalic and atraumatic.     Right  Ear: External ear normal.     Left Ear: External ear normal.     Nose: Nose normal.     Mouth/Throat:     Mouth: Mucous membranes are moist.  Eyes:     General: No scleral icterus.    Conjunctiva/sclera: Conjunctivae normal.  Cardiovascular:     Rate and Rhythm: Normal rate and regular rhythm.     Heart sounds: Normal heart sounds. No murmur heard.    No friction rub. No gallop.  Pulmonary:     Effort: Pulmonary effort is normal. No respiratory distress.     Breath sounds: Normal breath sounds.  Abdominal:     General: Bowel sounds are normal. There is no distension.     Palpations: Abdomen is soft. There is no mass.     Tenderness: There is no abdominal tenderness. There is no guarding.  Musculoskeletal:     Cervical back: Normal range of motion.  Skin:    General: Skin is warm and dry.     Findings: Rash present.     Comments: Beefy red rash of the groin consistent with tinea albicans  Neurological:     Mental Status: She is alert and oriented to person, place, and time.  Psychiatric:        Behavior:  Behavior normal.     ED Results / Procedures / Treatments   Labs (all labs ordered are listed, but only abnormal results are displayed) Labs Reviewed  GROUP A STREP BY PCR  WET PREP, GENITAL  URINALYSIS, ROUTINE W REFLEX MICROSCOPIC  GC/CHLAMYDIA PROBE AMP ([place]) NOT AT [facility]    EKG None  Radiology No results found.  Procedures Procedures    Medications Ordered in ED Medications - No data to display  ED Course/ Medical Decision Making/ A&P Clinical Course as of 05/08/23 1754  Fri May 08, 2023  1752 Urinalysis, Routine w reflex microscopic -Urine, Clean Catch(!) [AH]  1752 Wet prep, genital [AH]  1752 Group A Strep by PCR [AH]    Clinical Course User Index [AH] Arthor Captain, PA-C                                 Medical Decision Making Amount and/or Complexity of Data Reviewed Labs: ordered.  Risk OTC drugs. Prescription drug  management.   22 year old female who presents emergency department with groin rash that is intensely itchy and vaginal discharge consistent with yeast vaginitis.  Wet prep is negative for all abnormalities however patient is having rash consistent with candidal tinea.  Will discharge with both intravaginal and topical treatment.  GC chlamydia is pending.  Patient is to follow-up in the outpatient setting with GYN.  She has no pelvic pain and otherwise appears appropriate for discharge.         Final Clinical Impression(s) / ED Diagnoses Final diagnoses:  None    Rx / DC Orders ED Discharge Orders     None         Arthor Captain, PA-C 05/08/23 1754    Jacalyn Lefevre, MD 05/08/23 1859

## 2023-05-08 NOTE — ED Triage Notes (Signed)
Patient has throat pain since last week. It is painful to talk and patient is hoarse. She also complains of BV and a yeast infection. She is currently [redacted] weeks pregnant.

## 2023-05-08 NOTE — Discharge Instructions (Addendum)
Your wet prep did not show BV, You may use the ketoconazole cream daily to the thighs and outside of the vagina, once a day for 14 days. You may use the vaginal cream at bed time for 3 nights.  Boric acid suppositories can be used 1 nightly IN THE VAGINA at bedtime for 14 days.  Or  1 every other night for 1 week and then once a weak as needed. Contact a health care provider if: You have a fever. Your symptoms go away and then return. Your symptoms do not get better with treatment. Your symptoms get worse. You have new symptoms. You develop blisters in or around your vagina. You have blood coming from your vagina and it is not your menstrual period. You develop pain in your abdomen.

## 2023-05-10 ENCOUNTER — Emergency Department (HOSPITAL_COMMUNITY)
Admission: EM | Admit: 2023-05-10 | Discharge: 2023-05-10 | Discharge status: Against Medical Advice | Payer: MEDICAID | Attending: Emergency Medicine | Admitting: Emergency Medicine

## 2023-05-10 DIAGNOSIS — Y92009 Unspecified place in unspecified non-institutional (private) residence as the place of occurrence of the external cause: Secondary | ICD-10-CM | POA: Diagnosis not present

## 2023-05-10 DIAGNOSIS — Z5329 Procedure and treatment not carried out because of patient's decision for other reasons: Secondary | ICD-10-CM | POA: Insufficient documentation

## 2023-05-10 DIAGNOSIS — X58XXXA Exposure to other specified factors, initial encounter: Secondary | ICD-10-CM | POA: Insufficient documentation

## 2023-05-10 DIAGNOSIS — S01512A Laceration without foreign body of oral cavity, initial encounter: Secondary | ICD-10-CM | POA: Diagnosis not present

## 2023-05-10 DIAGNOSIS — S0993XA Unspecified injury of face, initial encounter: Secondary | ICD-10-CM | POA: Diagnosis present

## 2023-05-10 DIAGNOSIS — Z5321 Procedure and treatment not carried out due to patient leaving prior to being seen by health care provider: Secondary | ICD-10-CM

## 2023-05-10 MED ORDER — LIDOCAINE VISCOUS HCL 2 % MT SOLN
15.0000 mL | Freq: Once | OROMUCOSAL | Status: AC
  Administered 2023-05-10: 15 mL via OROMUCOSAL
  Filled 2023-05-10: qty 15

## 2023-05-10 NOTE — ED Provider Notes (Signed)
  [place] EMERGENCY DEPARTMENT AT [facility] Provider Note   CSN: [id number] Arrival date & time: 05/10/23  0710     History {Add pertinent medical, surgical, social history, OB history to HPI:1} Chief Complaint  Patient presents with  . Mouth Injury    Taylor Brennan is a 22 y.o. female.   Mouth Injury      Home Medications Prior to Admission medications   Medication Sig Start Date End Date Taking? Authorizing Provider  clotrimazole (CLOTRIMAZOLE 3) 2 % vaginal cream Place 1 Applicatorful vaginally at bedtime. 05/08/23   Harris, Cammy Copa, PA-C  Doxylamine-Pyridoxine (DICLEGIS) 10-10 MG TBEC Take 2 tablets by mouth daily. Start with 2 delayed-release tablets by mouth on a daily basis at bedtime If symptoms not adequately controlled, increase dose to 4 tablets each day (1 tab in AM, 1 tab mid-afternoon, and 2 tabs at bedtime) 04/29/23   Henderly, Britni A, PA-C  ketoconazole (NIZORAL) 2 % cream Apply 1 Application topically daily. 05/08/23   Arthor Captain, PA-C  Prenatal Vit-Fe Fumarate-FA (PRENATAL VITAMIN) 27-0.8 MG TABS Take 1 tablet by mouth daily. 04/29/23   Henderly, Britni A, PA-C      Allergies    Patient has no known allergies.    Review of Systems   Review of Systems  Physical Exam Updated Vital Signs BP 110/71 (BP Location: Right Arm)   Pulse 87   Temp 98.2 F (36.8 C) (Oral)   Resp 17   Ht 5\' 1"  (1.549 m)   Wt 77.9 kg   LMP 12/26/2022   SpO2 100%   BMI 32.44 kg/m  Physical Exam  ED Results / Procedures / Treatments   Labs (all labs ordered are listed, but only abnormal results are displayed) Labs Reviewed - No data to display  EKG None  Radiology No results found.  Procedures Procedures   Medications Ordered in ED Medications - No data to display  ED Course/ Medical Decision Making/ A&P   Medical Decision Making Risk Prescription drug management.   22 y.o. female presents to the ER today for evaluation of ***.  Differential diagnosis includes but is not limited to ***. Vital signs show ***. Physical exam as noted above.   ***  ***We discussed plan at bedside. We discussed strict return precautions and red flag symptoms. The patient verbalized their understanding and agrees to the plan. The patient is stable and being discharged home in good condition.  ***Portions of this report may have been transcribed using voice recognition software. Every effort was made to ensure accuracy; however, inadvertent computerized transcription errors may be present.   Final Clinical Impression(s) / ED Diagnoses Final diagnoses:  None    Rx / DC Orders ED Discharge Orders     None
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
## 2023-05-11 LAB — GC/CHLAMYDIA PROBE AMP (~~LOC~~) NOT AT ARMC
Chlamydia: NEGATIVE
Comment: NEGATIVE
Comment: NORMAL
Neisseria Gonorrhea: NEGATIVE

## 2023-06-01 ENCOUNTER — Ambulatory Visit (INDEPENDENT_AMBULATORY_CARE_PROVIDER_SITE_OTHER): Payer: MEDICAID | Admitting: Nurse Practitioner

## 2023-06-01 ENCOUNTER — Encounter: Payer: Self-pay | Admitting: Nurse Practitioner

## 2023-06-01 VITALS — BP 110/72 | HR 75 | Temp 97.1°F | Ht 61.0 in | Wt 169.6 lb

## 2023-06-01 DIAGNOSIS — Z3A09 9 weeks gestation of pregnancy: Secondary | ICD-10-CM | POA: Insufficient documentation

## 2023-06-01 DIAGNOSIS — O99341 Other mental disorders complicating pregnancy, first trimester: Secondary | ICD-10-CM

## 2023-06-01 DIAGNOSIS — G43009 Migraine without aura, not intractable, without status migrainosus: Secondary | ICD-10-CM

## 2023-06-01 DIAGNOSIS — F314 Bipolar disorder, current episode depressed, severe, without psychotic features: Secondary | ICD-10-CM | POA: Diagnosis not present

## 2023-06-01 MED ORDER — PROMETHAZINE HCL 12.5 MG PO TABS: 12.5000 mg | ORAL_TABLET | Freq: Three times a day (TID) | ORAL | 0 refills | Status: DC | PRN
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
## 2023-06-01 NOTE — Assessment & Plan Note (Addendum)
Uncertain gestational age with the last menstrual period around September, she is experiencing all-day nausea, fatigue, frequent urination, dizziness, and headaches without established prenatal care. We will order CMP, CBC, beta HCG to estimate gestational age, refer her to an OBGYN for ultrasound and prenatal care, prescribe Promethazine 12.5mg  every 8 hours as needed for nausea/vomiting, and advise her to continue prenatal vitamins.

## 2023-06-01 NOTE — Patient Instructions (Addendum)
It was great to see you!  Start promethazine every 8 hours as needed for nausea or vomiting  I have placed a referral to OBGYN and psychiatry, they will call to schedule  Keep taking your prenatal vitamin daily.   Let's follow-up in 4 months, sooner if you have concerns.  If a referral was placed today, you will be contacted for an appointment. Please note that routine referrals can sometimes take up to 3-4 weeks to process. Please call our office if you haven't heard anything after this time frame.  Take care,  Rodman Pickle, NP  Safe Medications in Pregnancy   Acne: Benzoyl Peroxide Salicylic Acid  Backache/Headache: Tylenol: 2 regular strength every 4 hours OR              2 Extra strength every 6 hours  Colds/Coughs/Allergies: Benadryl (alcohol free) 25 mg every 6 hours as needed Breath right strips Claritin Cepacol throat lozenges Chloraseptic throat spray Cold-Eeze- up to three times per day Cough drops, alcohol free Flonase (by prescription only) Guaifenesin Mucinex Robitussin DM (plain only, alcohol free) Saline nasal spray/drops Sudafed (pseudoephedrine) & Actifed ** use only after [redacted] weeks gestation and if you do not have high blood pressure Tylenol Vicks Vaporub Zinc lozenges Zyrtec   Constipation: Colace Ducolax suppositories Fleet enema Glycerin suppositories Metamucil Milk of magnesia Miralax Senokot Smooth move tea  Diarrhea: Kaopectate Imodium A-D  *NO pepto Bismol  Hemorrhoids: Anusol Anusol HC Preparation H Tucks  Indigestion: Tums Maalox Mylanta Zantac  Pepcid  Insomnia: Benadryl (alcohol free) 25mg  every 6 hours as needed Tylenol PM Unisom, no Gelcaps  Leg Cramps: Tums MagGel  Nausea/Vomiting:  Bonine Dramamine Emetrol Ginger extract Sea bands Meclizine  Nausea medication to take during pregnancy:  Unisom (doxylamine succinate 25 mg tablets) Take one tablet daily at bedtime. If symptoms are not adequately  controlled, the dose can be increased to a maximum recommended dose of two tablets daily (1/2 tablet in the morning, 1/2 tablet mid-afternoon and one at bedtime). Vitamin B6 100mg  tablets. Take one tablet twice a day (up to 200 mg per day).  Skin Rashes: Aveeno products Benadryl cream or 25mg  every 6 hours as needed Calamine Lotion 1% cortisone cream  Yeast infection: Gyne-lotrimin 7 Monistat 7   **If taking multiple medications, please check labels to avoid duplicating the same active ingredients **take medication as directed on the label ** Do not exceed 4000 mg of tylenol in 24 hours **Do not take medications that contain aspirin or ibuprofen

## 2023-06-01 NOTE — Assessment & Plan Note (Addendum)
Chronic, not controlled. She has a history of bipolar disorder and is currently not on medication, reporting irritability and difficulty regulating emotions. We will refer her to Psychiatry for evaluation and management.

## 2023-06-01 NOTE — Assessment & Plan Note (Signed)
She has a recent onset of migraines. We advise Tylenol for migraines as needed.

## 2023-06-02 LAB — COMPREHENSIVE METABOLIC PANEL
ALT: 12 U/L (ref 0–35)
AST: 15 U/L (ref 0–37)
Albumin: 4.2 g/dL (ref 3.5–5.2)
Alkaline Phosphatase: 44 U/L (ref 39–117)
BUN: 7 mg/dL (ref 6–23)
CO2: 24 meq/L (ref 19–32)
Calcium: 9.4 mg/dL (ref 8.4–10.5)
Chloride: 103 meq/L (ref 96–112)
Creatinine, Ser: 0.55 mg/dL (ref 0.40–1.20)
GFR: 131.06 mL/min (ref >60.00)
Glucose, Bld: 76 mg/dL (ref 70–99)
Potassium: 4.2 meq/L (ref 3.5–5.1)
Sodium: 134 meq/L — ABNORMAL LOW (ref 135–145)
Total Bilirubin: 0.6 mg/dL (ref 0.2–1.2)
Total Protein: 7.2 g/dL (ref 6.0–8.3)

## 2023-06-02 LAB — SPECIMEN STATUS

## 2023-06-03 LAB — BETA HCG QUANT (REF LAB): hCG Quant: 150978 m[IU]/mL

## 2023-06-09 ENCOUNTER — Ambulatory Visit: Payer: MEDICAID

## 2023-06-09 ENCOUNTER — Other Ambulatory Visit (INDEPENDENT_AMBULATORY_CARE_PROVIDER_SITE_OTHER): Payer: MEDICAID

## 2023-06-09 VITALS — BP 112/74 | HR 81 | Wt 166.8 lb

## 2023-06-09 DIAGNOSIS — Z3A11 11 weeks gestation of pregnancy: Secondary | ICD-10-CM

## 2023-06-09 DIAGNOSIS — O3680X0 Pregnancy with inconclusive fetal viability, not applicable or unspecified: Secondary | ICD-10-CM

## 2023-06-09 DIAGNOSIS — Z3481 Encounter for supervision of other normal pregnancy, first trimester: Secondary | ICD-10-CM

## 2023-06-09 DIAGNOSIS — Z3401 Encounter for supervision of normal first pregnancy, first trimester: Secondary | ICD-10-CM | POA: Diagnosis not present

## 2023-06-09 DIAGNOSIS — Z3480 Encounter for supervision of other normal pregnancy, unspecified trimester: Secondary | ICD-10-CM

## 2023-06-09 MED ORDER — BLOOD PRESSURE KIT DEVI: 1.0000 | 0 refills | Status: DC

## 2023-06-09 NOTE — Patient Instructions (Signed)
[facility] [employer] [facility] has a partnership with the [employer] to provide prenatal navigation for the most needed resources in our community. In order to see how we can help connect you to these resources we need consent to contact you. Please complete the very short consent using the link below:   English Link: https://[place].tfaforms.net/283?site=16  Spanish Link: https://[place].tfaforms.net/287?site=16  Options for Doula Care in the [place] Area  As you review your birthing options, consider having a birth doula. A doula is trained to provide support before, during and just after you give birth. There are also postpartum doulas that help you adjust to new parenthood.  While doulas do not provide medical care, they do provide emotional, physical and educational support. A few months before your baby arrives, doulas can help answer questions, ease concerns and help you create and support your birthing plan.    Doulas can help reduce your stress and comfort you and your partner. They can help you cope with labor by helping you use breathing techniques, massage, creative labor positioning, essential oils and affirmations.   Studies show that the benefits of having a doula include:   A more positive birth experience  Fewer requests for pain-relief medication  Less likelihood of cesarean section, commonly called a c-section   Doulas are typically hired via a [employer] account planner between you and the doula. We are happy to provide a list of the most active doulas in the area, all of whom are credentialed by [facility] and will not count as a visitor at your birth.  There are several options for no-cost doula care at our hospital, including:  [facility] Volunteer Doula Program Every [employer] Program A Cure 4 Moms Doula Study (available only at [employer] [facility], [place], [place] and [facility] offices)  For more information on these programs or to receive  a list of doulas active in our area, please email [email or web address]  Our practice his participating in a study that provides no-cost doula care. ACURE4Moms is a study looking at how doula care can reduce birthing disparities for Black and brown birthing people. We like to refer patients as soon as possible, but definitely before 28 weeks so patients can get to know their doula.    A doula is trained to provide support before, during and just after you give birth. While doulas do not provide medical care, they do provide emotional, physical and educational support. Doulas can help reduce your stress and comfort you and your partner. They can help you cope with labor by helping you use breathing techniques, massage, creative labor positioning, essential oils and affirmations.   ACURE4Moms is a research study trying to reduce:   low birthweight babies  emergency department visits & hospitalizations for birthing persons and their babies  depression among birthing people  discrimination in pregnancy-related care ACURE4Moms is trying out 2 programs designed by  people who have given birth. These programs include: 1. Sharing patient data and warning alerts with clinic staff to keep them accountable for their patients' outcomes and providing tools to help them  reduce bias in care. 2. Matching eligible patients with doulas from the  same community as the patients.  If you would like to participate in this study, please visit:   http://carroll-castaneda.info/

## 2023-06-09 NOTE — Progress Notes (Signed)
New OB Intake  I connected with Taylor Brennan  on 06/09/23 at  3:40 PM EST by In Person Visit and verified that I am speaking with the correct person using two identifiers. Nurse is located at [facility] and pt is located at [place].  I discussed the limitations, risks, security and privacy concerns of performing an evaluation and management service by telephone and the availability of in person appointments. I also discussed with the patient that there may be a patient responsible charge related to this service. The patient expressed understanding and agreed to proceed.  I explained I am completing New OB Intake today. We discussed EDD of Not found.. Pt is [id number]. I reviewed her allergies, medications and Medical/Surgical/OB history.    Patient Active Problem List   Diagnosis Date Noted   [redacted] weeks gestation of pregnancy 06/01/2023   Migraine without aura and without status migrainosus, not intractable 06/01/2023   Bipolar 1 disorder, depressed, severe (HCC) 06/08/2019   Cannabis use disorder, mild, abuse 06/08/2019   Suicide attempt (HCC) 06/08/2019    Concerns addressed today  Delivery Plans Plans to deliver at [facility]. Discussed the nature of our practice with multiple providers including residents and students. Due to the size of the practice, the delivering provider may not be the same as those providing prenatal care.   Patient is not interested in water birth. Offered upcoming OB visit with CNM to discuss further.  MyChart/Babyscripts MyChart access verified. I explained pt will have some visits in office and some virtually. Babyscripts instructions given and order placed. Patient verifies receipt of registration text/e-mail. Account successfully created and app downloaded.  Blood Pressure Cuff/Weight Scale Blood pressure cuff ordered for patient to pick-up from [employer]. Explained after first prenatal appt pt will check weekly and document in Babyscripts. Patient does not  have weight scale; patient may purchase if they desire to track weight weekly in Babyscripts.  Anatomy US Explained first scheduled [place] will be around 19 weeks. Anatomy US scheduled for TBD at TBD.   Is patient a candidate for Babyscripts Optimization? Yes   First visit review I reviewed new OB appt with patient. Explained pt will be seen by Dr. Debroah Loop at first visit. Discussed Avelina Laine genetic screening with patient. Requests Panorama. Routine prenatal labs  not collected/ deferred due to time.     Last Pap No results found for: "DIAGPAP"  Harrel Lemon, RN 06/09/2023  3:38 PM

## 2023-06-10 ENCOUNTER — Institutional Professional Consult (permissible substitution): Payer: Self-pay | Admitting: Licensed Clinical Social Worker

## 2023-06-10 ENCOUNTER — Encounter: Payer: MEDICAID | Admitting: Obstetrics & Gynecology

## 2023-06-15 ENCOUNTER — Ambulatory Visit (HOSPITAL_COMMUNITY): Payer: MEDICAID | Admitting: Psychiatry

## 2023-06-15 ENCOUNTER — Encounter (HOSPITAL_COMMUNITY): Payer: Self-pay | Admitting: Psychiatry

## 2023-06-15 DIAGNOSIS — F1994 Other psychoactive substance use, unspecified with psychoactive substance-induced mood disorder: Secondary | ICD-10-CM

## 2023-06-15 DIAGNOSIS — F4310 Post-traumatic stress disorder, unspecified: Secondary | ICD-10-CM

## 2023-06-15 DIAGNOSIS — F411 Generalized anxiety disorder: Secondary | ICD-10-CM

## 2023-06-15 MED ORDER — HYDROXYZINE HCL 10 MG PO TABS: 10.0000 mg | ORAL_TABLET | Freq: Three times a day (TID) | ORAL | 3 refills | Status: DC | PRN

## 2023-06-15 MED ORDER — LAMOTRIGINE 25 MG PO TABS
25.0000 mg | ORAL_TABLET | Freq: Every day | ORAL | 1 refills | Status: DC
Start: 2023-06-15 — End: 2023-07-16
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
## 2023-06-16 ENCOUNTER — Ambulatory Visit (HOSPITAL_COMMUNITY): Payer: Self-pay | Admitting: Psychiatry

## 2023-07-08 NOTE — L&D Delivery Note (Signed)
 OB/GYN Faculty Practice Delivery Note  Taylor Brennan is a 23 y.o. [id number] s/p SVD at [redacted]w[redacted]d. She was admitted for non-reactive NST.   ROM: 2h 1m with clear fluid GBS Status: Positive/-- (05/27 1428) Maximum Maternal Temperature: Temp (24hrs), Avg:98.1 F (36.7 C), Min:97.8 F (36.6 C), Max:98.5 F (36.9 C)   Labor Progress: Initial SVE: 4/50/-3. AROM required. She then quickly progressed to complete.   Delivery Date/Time: 12/29/23 1010 Delivery: Called to room and patient was complete and ready to push. Head delivered OA to ROA. No nuchal cord present. Shoulder and body delivered in usual fashion. Infant with spontaneous cry, placed on mother's abdomen, dried and stimulated. Cord clamped x 2 after +1-minute delay, and cut by FOB. Cord gases not obtained. Cord blood drawn. Placenta delivered spontaneously with gentle cord traction. Fundus firm with massage and Pitocin . Labia, perineum, and vagina inspected with no lacerations. Mom and baby to postpartum. Baby Weight: pending  Placenta: 3 vessel, intact. Sent to home with patient Complications: None Lacerations: None EBL: 300 mL Anesthesia: epidural  Infant: baby girl Xolani APGAR (1 MIN): 9  APGAR (5 MINS): 9  APGAR (10 MINS):    Almarie CHRISTELLA Moats, MD [facility] Family Medicine Fellow, [facility], [facility] 12/29/2023, 3:14 PM

## 2023-07-09 ENCOUNTER — Telehealth: Payer: Self-pay | Admitting: Nurse Practitioner

## 2023-07-09 ENCOUNTER — Encounter: Payer: MEDICAID | Admitting: Obstetrics and Gynecology

## 2023-07-09 ENCOUNTER — Ambulatory Visit: Payer: MEDICAID | Admitting: Nurse Practitioner

## 2023-07-09 NOTE — Telephone Encounter (Signed)
 01.02.25 no show

## 2023-07-13 ENCOUNTER — Encounter: Payer: MEDICAID | Admitting: Advanced Practice Midwife

## 2023-07-13 ENCOUNTER — Institutional Professional Consult (permissible substitution): Payer: Self-pay | Admitting: Licensed Clinical Social Worker

## 2023-07-15 NOTE — Telephone Encounter (Signed)
 Noted.

## 2023-07-15 NOTE — Telephone Encounter (Signed)
 1st no show, letter sent via [facility]

## 2023-07-16 ENCOUNTER — Encounter (HOSPITAL_COMMUNITY): Payer: Self-pay | Admitting: Psychiatry

## 2023-07-16 ENCOUNTER — Telehealth (INDEPENDENT_AMBULATORY_CARE_PROVIDER_SITE_OTHER): Payer: MEDICAID | Admitting: Psychiatry

## 2023-07-16 DIAGNOSIS — F411 Generalized anxiety disorder: Secondary | ICD-10-CM | POA: Diagnosis not present

## 2023-07-16 DIAGNOSIS — F4310 Post-traumatic stress disorder, unspecified: Secondary | ICD-10-CM | POA: Diagnosis not present

## 2023-07-16 DIAGNOSIS — F1994 Other psychoactive substance use, unspecified with psychoactive substance-induced mood disorder: Secondary | ICD-10-CM

## 2023-07-16 MED ORDER — LAMOTRIGINE 100 MG PO TABS: 100.0000 mg | ORAL_TABLET | Freq: Every day | ORAL | 3 refills | Status: DC
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
## 2023-07-21 ENCOUNTER — Ambulatory Visit (INDEPENDENT_AMBULATORY_CARE_PROVIDER_SITE_OTHER): Payer: MEDICAID | Admitting: Advanced Practice Midwife

## 2023-07-21 ENCOUNTER — Other Ambulatory Visit (HOSPITAL_COMMUNITY)
Admission: RE | Admit: 2023-07-21 | Discharge: 2023-07-21 | Disposition: A | Discharge status: Home / Self Care | Payer: MEDICAID | Source: Ambulatory Visit | Attending: Advanced Practice Midwife | Admitting: Advanced Practice Midwife

## 2023-07-21 ENCOUNTER — Encounter: Payer: Self-pay | Admitting: Advanced Practice Midwife

## 2023-07-21 VITALS — BP 122/74 | HR 74 | Wt 177.4 lb

## 2023-07-21 DIAGNOSIS — Z3A17 17 weeks gestation of pregnancy: Secondary | ICD-10-CM | POA: Diagnosis not present

## 2023-07-21 DIAGNOSIS — Z3480 Encounter for supervision of other normal pregnancy, unspecified trimester: Secondary | ICD-10-CM | POA: Diagnosis present

## 2023-07-21 DIAGNOSIS — O219 Vomiting of pregnancy, unspecified: Secondary | ICD-10-CM

## 2023-07-21 DIAGNOSIS — F4310 Post-traumatic stress disorder, unspecified: Secondary | ICD-10-CM | POA: Insufficient documentation

## 2023-07-21 DIAGNOSIS — Z3482 Encounter for supervision of other normal pregnancy, second trimester: Secondary | ICD-10-CM

## 2023-07-21 DIAGNOSIS — Z01419 Encounter for gynecological examination (general) (routine) without abnormal findings: Secondary | ICD-10-CM | POA: Insufficient documentation

## 2023-07-21 DIAGNOSIS — F314 Bipolar disorder, current episode depressed, severe, without psychotic features: Secondary | ICD-10-CM

## 2023-07-21 DIAGNOSIS — O99342 Other mental disorders complicating pregnancy, second trimester: Secondary | ICD-10-CM | POA: Diagnosis not present

## 2023-07-21 DIAGNOSIS — Z31430 Encounter of female for testing for genetic disease carrier status for procreative management: Secondary | ICD-10-CM | POA: Diagnosis not present
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
## 2023-07-21 NOTE — Progress Notes (Signed)
 Pt presents for NOB visit. No concerns.

## 2023-07-22 LAB — CBC/D/PLT+RPR+RH+ABO+RUBIGG...
Antibody Screen: NEGATIVE
Basophils Absolute: 0 10*3/uL (ref 0.0–0.2)
Basos: 0 %
EOS (ABSOLUTE): 0.1 10*3/uL (ref 0.0–0.4)
Eos: 1 %
HCV Ab: NONREACTIVE
HIV Screen 4th Generation wRfx: NONREACTIVE
Hematocrit: 37.3 % (ref 34.0–46.6)
Hemoglobin: 12.5 g/dL (ref 11.1–15.9)
Hepatitis B Surface Ag: NEGATIVE
Immature Grans (Abs): 0 10*3/uL (ref 0.0–0.1)
Immature Granulocytes: 0 %
Lymphocytes Absolute: 2.7 10*3/uL (ref 0.7–3.1)
Lymphs: 34 %
MCH: 33.5 pg — ABNORMAL HIGH (ref 26.6–33.0)
MCHC: 33.5 g/dL (ref 31.5–35.7)
MCV: 100 fL — ABNORMAL HIGH (ref 79–97)
Monocytes Absolute: 0.3 10*3/uL (ref 0.1–0.9)
Monocytes: 4 %
Neutrophils Absolute: 4.7 10*3/uL (ref 1.4–7.0)
Neutrophils: 61 %
Platelets: 250 10*3/uL (ref 150–450)
RBC: 3.73 x10E6/uL — ABNORMAL LOW (ref 3.77–5.28)
RDW: 12.1 % (ref 11.7–15.4)
RPR Ser Ql: NONREACTIVE
Rh Factor: POSITIVE
Rubella Antibodies, IGG: 4.82 {index} (ref >0.99)
WBC: 7.8 10*3/uL (ref 3.4–10.8)

## 2023-07-22 LAB — HEMOGLOBIN A1C
Est. average glucose Bld gHb Est-mCnc: 103 mg/dL
Hgb A1c MFr Bld: 5.2 % (ref 4.8–5.6)

## 2023-07-22 LAB — CERVICOVAGINAL ANCILLARY ONLY
Chlamydia: NEGATIVE
Comment: NEGATIVE
Comment: NEGATIVE
Comment: NORMAL
Neisseria Gonorrhea: NEGATIVE
Trichomonas: NEGATIVE

## 2023-07-22 LAB — HCV INTERPRETATION

## 2023-07-23 LAB — CULTURE, OB URINE

## 2023-07-23 LAB — URINE CULTURE, OB REFLEX

## 2023-07-24 LAB — AFP, SERUM, OPEN SPINA BIFIDA
AFP MoM: 0.87
AFP Value: 32.5 ng/mL
Gest. Age on Collection Date: 17 wk
Maternal Age At EDD: 21.9 a
OSBR Risk 1 IN: 10000
Test Results:: NEGATIVE
Weight: 177 [lb_av]

## 2023-07-27 DIAGNOSIS — O99210 Obesity complicating pregnancy, unspecified trimester: Secondary | ICD-10-CM | POA: Insufficient documentation

## 2023-07-28 LAB — CYTOLOGY - PAP
Adequacy: ABSENT
Diagnosis: NEGATIVE

## 2023-07-28 LAB — PANORAMA PRENATAL TEST FULL PANEL:PANORAMA TEST PLUS 5 ADDITIONAL MICRODELETIONS: FETAL FRACTION: 12.1

## 2023-07-29 LAB — HORIZON CUSTOM: REPORT SUMMARY: NEGATIVE

## 2023-08-03 ENCOUNTER — Ambulatory Visit: Payer: MEDICAID | Attending: Obstetrics and Gynecology

## 2023-08-03 ENCOUNTER — Other Ambulatory Visit: Payer: Self-pay

## 2023-08-03 ENCOUNTER — Ambulatory Visit: Payer: MEDICAID | Admitting: *Deleted

## 2023-08-03 ENCOUNTER — Encounter: Payer: Self-pay | Admitting: *Deleted

## 2023-08-03 ENCOUNTER — Ambulatory Visit: Payer: MEDICAID | Admitting: Obstetrics and Gynecology

## 2023-08-03 VITALS — BP 117/60 | HR 94

## 2023-08-03 DIAGNOSIS — O09292 Supervision of pregnancy with other poor reproductive or obstetric history, second trimester: Secondary | ICD-10-CM

## 2023-08-03 DIAGNOSIS — Z3480 Encounter for supervision of other normal pregnancy, unspecified trimester: Secondary | ICD-10-CM | POA: Insufficient documentation

## 2023-08-03 DIAGNOSIS — O99322 Drug use complicating pregnancy, second trimester: Secondary | ICD-10-CM | POA: Insufficient documentation

## 2023-08-03 DIAGNOSIS — O99342 Other mental disorders complicating pregnancy, second trimester: Secondary | ICD-10-CM | POA: Insufficient documentation

## 2023-08-03 DIAGNOSIS — F319 Bipolar disorder, unspecified: Secondary | ICD-10-CM | POA: Diagnosis not present

## 2023-08-03 DIAGNOSIS — Z3A19 19 weeks gestation of pregnancy: Secondary | ICD-10-CM

## 2023-08-03 DIAGNOSIS — O355XX0 Maternal care for (suspected) damage to fetus by drugs, not applicable or unspecified: Secondary | ICD-10-CM

## 2023-08-03 DIAGNOSIS — O359XX0 Maternal care for (suspected) fetal abnormality and damage, unspecified, not applicable or unspecified: Secondary | ICD-10-CM | POA: Insufficient documentation

## 2023-08-03 DIAGNOSIS — O99210 Obesity complicating pregnancy, unspecified trimester: Secondary | ICD-10-CM | POA: Insufficient documentation

## 2023-08-03 DIAGNOSIS — O99212 Obesity complicating pregnancy, second trimester: Secondary | ICD-10-CM | POA: Insufficient documentation

## 2023-08-03 NOTE — Progress Notes (Signed)
Maternal-Fetal Medicine  I had the pleasure of seeing Ms. Taylor Brennan today at [facility] for [facility]. She is G3 P2002 at 19-weeks' gestation and is here for fetal anatomy scan.  On cell-free fetal DNA screening, the risks of fetal aneuploidies are not increased.  MSAFP screening showed low risk for open neural tube defects. Obstetric history significant for 2 term vaginal deliveries. Her second child had congenital pulmonary airway malformation (CPAM) that was diagnosed prenatally.  At age 52 months, the baby had successful surgery and she is in good health now.  Patient has bipolar disorder and is being followed by her psychiatrist.  She takes lamotrigine 100 mg daily and hydroxyzine 10 mg 3 times daily as needed. She denies tobacco or recreational drug or alcohol use in pregnancy.  Ultrasound We performed fetal anatomy scan. No makers of aneuploidies or fetal structural defects are seen.  Fetal thorax appears normal and there is no evidence of any mass in the chest.  Fetal biometry is consistent with her previously-established dates. Amniotic fluid is normal and good fetal activity is seen. Patient understands the limitations of ultrasound in detecting fetal anomalies.  I reassured the patient that there is no evidence of CPAM.  Our concerns include: Lamotrigine in pregnancy From the registry that reported 1,558 pregnancies exposed to lamotrigene, no increased congenital malformations (over the background rate) have been reported. Some reports showed increased  risk of cleft lip/palate with lamictal exposure in the first trimester. Absolute risk is reported to be 0.1% and 0.4%.  Lamotrigene is secreted in breast milk.   Hydroxyzine Human data showed no increased risk of fetal congenital malformations.  Neonatal withdrawal symptoms or seizures with hydroxyzine more than 600 mg daily near-term have been reported. Patient reports she takes only as needed.  Recommendations -An  appointment was made for her to return in 6 weeks for fetal growth assessment. -Fetal growth assessment at [redacted] weeks gestation.   Thank you for consultation.  If you have any questions or concerns, please contact me the [facility].  Consultation including face-to-face (more than 50%) counseling 30 minutes.

## 2023-08-03 NOTE — Progress Notes (Unsigned)
Maternal-Fetal Medicine  I had the pleasure of seeing Ms. Taylor Brennan today at [facility] for [facility]. She is G3 P2002 at 19-weeks' gestation and is here for fetal anatomy scan.  On cell-free fetal DNA screening, the risks of fetal aneuploidies are not increased.  MSAFP screening showed low risk for open neural tube defects. Obstetric history significant for 2 term vaginal deliveries. Her second child had congenital pulmonary airway malformation (CPAM) that was diagnosed prenatally.  At age 52 months, the baby had successful surgery and she is in good health now.  Patient has bipolar disorder and is being followed by her psychiatrist.  She takes lamotrigine 100 mg daily and hydroxyzine 10 mg 3 times daily as needed. She denies tobacco or recreational drug or alcohol use in pregnancy.  Ultrasound We performed fetal anatomy scan. No makers of aneuploidies or fetal structural defects are seen.  Fetal thorax appears normal and there is no evidence of any mass in the chest.  Fetal biometry is consistent with her previously-established dates. Amniotic fluid is normal and good fetal activity is seen. Patient understands the limitations of ultrasound in detecting fetal anomalies.  I reassured the patient that there is no evidence of CPAM.  Our concerns include: Lamotrigine in pregnancy From the registry that reported 1,558 pregnancies exposed to lamotrigene, no increased congenital malformations (over the background rate) have been reported. Some reports showed increased  risk of cleft lip/palate with lamictal exposure in the first trimester. Absolute risk is reported to be 0.1% and 0.4%.  Lamotrigene is secreted in breast milk.   Hydroxyzine Human data showed no increased risk of fetal congenital malformations.  Neonatal withdrawal symptoms or seizures with hydroxyzine more than 600 mg daily near-term have been reported. Patient reports she takes only as needed.  Recommendations -An  appointment was made for her to return in 6 weeks for fetal growth assessment. -Fetal growth assessment at [redacted] weeks gestation.   Thank you for consultation.  If you have any questions or concerns, please contact me the [facility].  Consultation including face-to-face (more than 50%) counseling 30 minutes.

## 2023-08-06 ENCOUNTER — Encounter: Payer: MEDICAID | Admitting: Obstetrics and Gynecology

## 2023-08-12 ENCOUNTER — Ambulatory Visit (HOSPITAL_COMMUNITY): Payer: Self-pay | Admitting: Licensed Clinical Social Worker

## 2023-08-13 ENCOUNTER — Ambulatory Visit (INDEPENDENT_AMBULATORY_CARE_PROVIDER_SITE_OTHER): Payer: MEDICAID | Admitting: Clinical

## 2023-08-13 DIAGNOSIS — F331 Major depressive disorder, recurrent, moderate: Secondary | ICD-10-CM | POA: Diagnosis not present
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
## 2023-08-18 ENCOUNTER — Encounter: Payer: Self-pay | Admitting: Advanced Practice Midwife

## 2023-08-18 ENCOUNTER — Ambulatory Visit (INDEPENDENT_AMBULATORY_CARE_PROVIDER_SITE_OTHER): Payer: MEDICAID | Admitting: Advanced Practice Midwife

## 2023-08-18 ENCOUNTER — Other Ambulatory Visit (HOSPITAL_COMMUNITY)
Admission: RE | Admit: 2023-08-18 | Discharge: 2023-08-18 | Disposition: A | Discharge status: Home / Self Care | Payer: MEDICAID | Source: Ambulatory Visit | Attending: Advanced Practice Midwife | Admitting: Advanced Practice Midwife

## 2023-08-18 VITALS — BP 110/70 | HR 88 | Wt 178.8 lb

## 2023-08-18 DIAGNOSIS — B3731 Acute candidiasis of vulva and vagina: Secondary | ICD-10-CM | POA: Insufficient documentation

## 2023-08-18 DIAGNOSIS — Z3A21 21 weeks gestation of pregnancy: Secondary | ICD-10-CM | POA: Diagnosis not present

## 2023-08-18 DIAGNOSIS — Z3480 Encounter for supervision of other normal pregnancy, unspecified trimester: Secondary | ICD-10-CM

## 2023-08-18 DIAGNOSIS — F314 Bipolar disorder, current episode depressed, severe, without psychotic features: Secondary | ICD-10-CM | POA: Diagnosis not present

## 2023-08-18 MED ORDER — TERCONAZOLE 0.4 % VA CREA: 1.0000 | TOPICAL_CREAM | Freq: Every day | VAGINAL | 0 refills | Status: DC

## 2023-08-18 NOTE — Progress Notes (Signed)
Pt presents for ROB visit. Pt c/o vaginal irritation for 1 week.

## 2023-08-18 NOTE — Addendum Note (Signed)
Addended by: Jearld Adjutant on: 08/18/2023 04:41 PM   Modules accepted: Orders

## 2023-08-18 NOTE — Progress Notes (Signed)
   PRENATAL VISIT NOTE  Subjective:  Taylor Brennan is a 23 y.o. G3P2002 at [redacted]w[redacted]d being seen today for ongoing prenatal care.  She is currently monitored for the following issues for this low-risk pregnancy and has Bipolar 1 disorder, depressed, severe (HCC); Cannabis use disorder, mild, abuse; Suicide attempt (HCC); [redacted] weeks gestation of pregnancy; Migraine without aura and without status migrainosus, not intractable; Supervision of other normal pregnancy, antepartum; PTSD (post-traumatic stress disorder); and Obesity affecting pregnancy, antepartum on their problem list.  Patient reports no complaints.  Contractions: Not present. Vag. Bleeding: None.  Movement: Present. Denies leaking of fluid.   The following portions of the patient's history were reviewed and updated as appropriate: allergies, current medications, past family history, past medical history, past social history, past surgical history and problem list.   Objective:   Vitals:   08/18/23 0954  BP: 110/70  Pulse: 88  Weight: 178 lb 12.8 oz (81.1 kg)    Fetal Status: Fetal Heart Rate (bpm): 156 Fundal Height: 21 cm Movement: Present     General:  Alert, oriented and cooperative. Patient is in no acute distress.  Skin: Skin is warm and dry. No rash noted.   Cardiovascular: Normal heart rate noted  Respiratory: Normal respiratory effort, no problems with respiration noted  Abdomen: Soft, gravid, appropriate for gestational age.  Pain/Pressure: Absent     Pelvic: Cervical exam deferred        Extremities: Normal range of motion.  Edema: Trace  Mental Status: Normal mood and affect. Normal behavior. Normal judgment and thought content.   Assessment and Plan:  Pregnancy: G3P2002 at [redacted]w[redacted]d 1. Supervision of other normal pregnancy, antepartum (Primary) --Anticipatory guidance about next visits/weeks of pregnancy given.   2. Bipolar 1 disorder, depressed, severe (HCC) --Well managed, has psychiatry  3. [redacted] weeks gestation of  pregnancy   Preterm labor symptoms and general obstetric precautions including but not limited to vaginal bleeding, contractions, leaking of fluid and fetal movement were reviewed in detail with the patient. Please refer to After Visit Summary for other counseling recommendations.   Return in about 4 weeks (around 09/15/2023) for LOB.  Future Appointments  Date Time Provider Department Center  09/15/2023 11:15 AM Allie Bossier, MD [facility] None  09/23/2023  3:30 PM Shanna Cisco, NP [facility] None  09/30/2023  2:20 PM McElwee, Jake Church, NP [facility]    Sharen Counter, CNM

## 2023-08-20 LAB — CERVICOVAGINAL ANCILLARY ONLY
Bacterial Vaginitis (gardnerella): NEGATIVE
Candida Glabrata: NEGATIVE
Candida Vaginitis: NEGATIVE
Chlamydia: NEGATIVE
Comment: NEGATIVE
Comment: NEGATIVE
Comment: NEGATIVE
Comment: NEGATIVE
Comment: NEGATIVE
Comment: NORMAL
Neisseria Gonorrhea: NEGATIVE
Trichomonas: NEGATIVE

## 2023-09-15 ENCOUNTER — Ambulatory Visit (INDEPENDENT_AMBULATORY_CARE_PROVIDER_SITE_OTHER): Payer: MEDICAID | Admitting: Obstetrics & Gynecology

## 2023-09-15 ENCOUNTER — Encounter: Payer: MEDICAID | Admitting: Obstetrics & Gynecology

## 2023-09-15 ENCOUNTER — Other Ambulatory Visit (HOSPITAL_COMMUNITY)
Admission: RE | Admit: 2023-09-15 | Discharge: 2023-09-15 | Disposition: A | Discharge status: Home / Self Care | Payer: MEDICAID | Source: Ambulatory Visit | Attending: Obstetrics & Gynecology | Admitting: Obstetrics & Gynecology

## 2023-09-15 VITALS — BP 124/79 | HR 101 | Wt 183.0 lb

## 2023-09-15 DIAGNOSIS — O99210 Obesity complicating pregnancy, unspecified trimester: Secondary | ICD-10-CM

## 2023-09-15 DIAGNOSIS — Z3A25 25 weeks gestation of pregnancy: Secondary | ICD-10-CM | POA: Diagnosis not present

## 2023-09-15 DIAGNOSIS — F314 Bipolar disorder, current episode depressed, severe, without psychotic features: Secondary | ICD-10-CM

## 2023-09-15 DIAGNOSIS — N898 Other specified noninflammatory disorders of vagina: Secondary | ICD-10-CM

## 2023-09-15 NOTE — Progress Notes (Signed)
 Pt presents for rob. Pt would like to switch from the Terazol cream to pill. Pt states that the cream does not work.

## 2023-09-15 NOTE — Progress Notes (Signed)
   PRENATAL VISIT NOTE  Subjective:  Taylor Brennan is a 23 y.o. G37P2002 ( 2 and 73 yo daughters) at [redacted]w[redacted]d being seen today for ongoing prenatal care.  She is currently monitored for the following issues for this low-risk pregnancy and has Bipolar 1 disorder, depressed, severe (HCC); Cannabis use disorder, mild, abuse; Suicide attempt (HCC); Migraine without aura and without status migrainosus, not intractable; Supervision of other normal pregnancy, antepartum; PTSD (post-traumatic stress disorder); and Obesity affecting pregnancy, antepartum on their problem list.  Patient reports vaginal itching. She is requesting diflucan.  Contractions: Not present. Vag. Bleeding: None.  Movement: Present. Denies leaking of fluid.   The following portions of the patient's history were reviewed and updated as appropriate: allergies, current medications, past family history, past medical history, past social history, past surgical history and problem list.   Objective:   Vitals:   09/15/23 1131  BP: 124/79  Pulse: (!) 101  Weight: 183 lb (83 kg)    Fetal Status: Fetal Heart Rate (bpm): 140 Fundal Height: 26 cm Movement: Present     General:  Alert, oriented and cooperative. Patient is in no acute distress.  Skin: Skin is warm and dry. No rash noted.   Cardiovascular: Normal heart rate noted  Respiratory: Normal respiratory effort, no problems with respiration noted  Abdomen: Soft, gravid, appropriate for gestational age.  Pain/Pressure: Absent     Pelvic: Cervical exam deferred        Extremities: Normal range of motion.  Edema: Trace (feet and hands)  Mental Status: Normal mood and affect. Normal behavior. Normal judgment and thought content.   Assessment and Plan:  Pregnancy: G3P2002 at [redacted]w[redacted]d 1. Obesity affecting pregnancy, antepartum, unspecified obesity type (Primary) - rec less than 15 pounds total weight gain  2. Bipolar 1 disorder, depressed, severe (HCC) - stable  3. [redacted] weeks gestation  of pregnancy   4. Vaginal itching - self swab (her preference) given, will treat accordingly  - Cervicovaginal ancillary only( [place])  Preterm labor symptoms and general obstetric precautions including but not limited to vaginal bleeding, contractions, leaking of fluid and fetal movement were reviewed in detail with the patient. Please refer to After Visit Summary for other counseling recommendations.   Return in about 3 weeks (around 10/06/2023).  Future Appointments  Date Time Provider Department Center  09/23/2023  3:30 PM Shanna Cisco, NP [facility] None  09/30/2023  2:20 PM McElwee, Jake Church, NP [facility]    Allie Bossier, MD

## 2023-09-16 LAB — CERVICOVAGINAL ANCILLARY ONLY
Bacterial Vaginitis (gardnerella): NEGATIVE
Candida Glabrata: NEGATIVE
Candida Vaginitis: POSITIVE — AB
Comment: NEGATIVE
Comment: NEGATIVE
Comment: NEGATIVE
Comment: NEGATIVE
Trichomonas: NEGATIVE

## 2023-09-18 ENCOUNTER — Other Ambulatory Visit: Payer: Self-pay

## 2023-09-18 MED ORDER — TERCONAZOLE 0.8 % VA CREA: 1.0000 | TOPICAL_CREAM | Freq: Every day | VAGINAL | 0 refills | Status: AC

## 2023-09-18 NOTE — Progress Notes (Signed)
 Pt called requesting rx for yeast from +results on 09/15/23. Sent per protocol

## 2023-09-23 ENCOUNTER — Telehealth (HOSPITAL_COMMUNITY): Payer: MEDICAID | Admitting: Psychiatry

## 2023-09-23 ENCOUNTER — Encounter (HOSPITAL_COMMUNITY): Payer: Self-pay | Admitting: Psychiatry

## 2023-09-23 DIAGNOSIS — F1994 Other psychoactive substance use, unspecified with psychoactive substance-induced mood disorder: Secondary | ICD-10-CM | POA: Diagnosis not present

## 2023-09-23 MED ORDER — LAMOTRIGINE 100 MG PO TABS: 100.0000 mg | ORAL_TABLET | Freq: Every day | ORAL | 3 refills | Status: DC
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
## 2023-09-29 ENCOUNTER — Other Ambulatory Visit: Payer: Self-pay

## 2023-09-29 ENCOUNTER — Emergency Department (HOSPITAL_COMMUNITY)
Admission: EM | Admit: 2023-09-29 | Discharge: 2023-09-29 | Disposition: A | Discharge status: Other Acute Inpatient Hospital | Payer: MEDICAID | Attending: Emergency Medicine | Admitting: Emergency Medicine

## 2023-09-29 ENCOUNTER — Inpatient Hospital Stay (HOSPITAL_COMMUNITY)
Admission: AD | Admit: 2023-09-29 | Discharge: 2023-10-03 | DRG: 833 | Disposition: A | Discharge status: Home / Self Care | Payer: MEDICAID | Source: Intra-hospital | Attending: Psychiatry | Admitting: Psychiatry

## 2023-09-29 DIAGNOSIS — O99342 Other mental disorders complicating pregnancy, second trimester: Principal | ICD-10-CM | POA: Diagnosis present

## 2023-09-29 DIAGNOSIS — Z3A27 27 weeks gestation of pregnancy: Secondary | ICD-10-CM | POA: Insufficient documentation

## 2023-09-29 DIAGNOSIS — Z9151 Personal history of suicidal behavior: Secondary | ICD-10-CM | POA: Diagnosis present

## 2023-09-29 DIAGNOSIS — Z87891 Personal history of nicotine dependence: Secondary | ICD-10-CM | POA: Diagnosis not present

## 2023-09-29 DIAGNOSIS — F39 Unspecified mood [affective] disorder: Principal | ICD-10-CM

## 2023-09-29 DIAGNOSIS — Z8249 Family history of ischemic heart disease and other diseases of the circulatory system: Secondary | ICD-10-CM | POA: Diagnosis not present

## 2023-09-29 DIAGNOSIS — O9A212 Injury, poisoning and certain other consequences of external causes complicating pregnancy, second trimester: Secondary | ICD-10-CM | POA: Diagnosis not present

## 2023-09-29 DIAGNOSIS — F314 Bipolar disorder, current episode depressed, severe, without psychotic features: Secondary | ICD-10-CM | POA: Diagnosis not present

## 2023-09-29 DIAGNOSIS — Z630 Problems in relationship with spouse or partner: Secondary | ICD-10-CM | POA: Diagnosis not present

## 2023-09-29 DIAGNOSIS — J45909 Unspecified asthma, uncomplicated: Secondary | ICD-10-CM | POA: Diagnosis not present

## 2023-09-29 DIAGNOSIS — Z62810 Personal history of physical and sexual abuse in childhood: Secondary | ICD-10-CM | POA: Diagnosis not present

## 2023-09-29 DIAGNOSIS — T50902A Poisoning by unspecified drugs, medicaments and biological substances, intentional self-harm, initial encounter: Secondary | ICD-10-CM

## 2023-09-29 DIAGNOSIS — T426X2A Poisoning by other antiepileptic and sedative-hypnotic drugs, intentional self-harm, initial encounter: Secondary | ICD-10-CM | POA: Diagnosis not present

## 2023-09-29 DIAGNOSIS — F1211 Cannabis abuse, in remission: Secondary | ICD-10-CM | POA: Diagnosis present

## 2023-09-29 DIAGNOSIS — Z8669 Personal history of other diseases of the nervous system and sense organs: Secondary | ICD-10-CM | POA: Diagnosis not present

## 2023-09-29 DIAGNOSIS — T1491XA Suicide attempt, initial encounter: Secondary | ICD-10-CM | POA: Diagnosis present

## 2023-09-29 DIAGNOSIS — O99512 Diseases of the respiratory system complicating pregnancy, second trimester: Secondary | ICD-10-CM | POA: Insufficient documentation

## 2023-09-29 DIAGNOSIS — Z79899 Other long term (current) drug therapy: Secondary | ICD-10-CM | POA: Diagnosis not present

## 2023-09-29 DIAGNOSIS — F319 Bipolar disorder, unspecified: Principal | ICD-10-CM | POA: Insufficient documentation

## 2023-09-29 DIAGNOSIS — Z818 Family history of other mental and behavioral disorders: Secondary | ICD-10-CM

## 2023-09-29 DIAGNOSIS — O26892 Other specified pregnancy related conditions, second trimester: Secondary | ICD-10-CM | POA: Diagnosis present

## 2023-09-29 LAB — CBC WITH DIFFERENTIAL/PLATELET
Abs Immature Granulocytes: 0.07 10*3/uL (ref 0.00–0.07)
Basophils Absolute: 0 10*3/uL (ref 0.0–0.1)
Basophils Relative: 0 %
Eosinophils Absolute: 0.1 10*3/uL (ref 0.0–0.5)
Eosinophils Relative: 1 %
HCT: 33.8 % — ABNORMAL LOW (ref 36.0–46.0)
Hemoglobin: 11.5 g/dL — ABNORMAL LOW (ref 12.0–15.0)
Immature Granulocytes: 1 %
Lymphocytes Relative: 33 %
Lymphs Abs: 3.2 10*3/uL (ref 0.7–4.0)
MCH: 33.6 pg (ref 26.0–34.0)
MCHC: 34 g/dL (ref 30.0–36.0)
MCV: 98.8 fL (ref 80.0–100.0)
Monocytes Absolute: 0.6 10*3/uL (ref 0.1–1.0)
Monocytes Relative: 6 %
Neutro Abs: 5.9 10*3/uL (ref 1.7–7.7)
Neutrophils Relative %: 59 %
Platelets: 215 10*3/uL (ref 150–400)
RBC: 3.42 MIL/uL — ABNORMAL LOW (ref 3.87–5.11)
RDW: 12.9 % (ref 11.5–15.5)
WBC: 9.9 10*3/uL (ref 4.0–10.5)
nRBC: 0 % (ref 0.0–0.2)

## 2023-09-29 LAB — RAPID URINE DRUG SCREEN, HOSP PERFORMED
Amphetamines: NOT DETECTED
Barbiturates: NOT DETECTED
Benzodiazepines: NOT DETECTED
Cocaine: NOT DETECTED
Opiates: NOT DETECTED
Tetrahydrocannabinol: NOT DETECTED

## 2023-09-29 LAB — COMPREHENSIVE METABOLIC PANEL
ALT: 11 U/L (ref 0–44)
AST: 16 U/L (ref 15–41)
Albumin: 2.7 g/dL — ABNORMAL LOW (ref 3.5–5.0)
Alkaline Phosphatase: 54 U/L (ref 38–126)
Anion gap: 10 (ref 5–15)
BUN: 5 mg/dL — ABNORMAL LOW (ref 6–20)
CO2: 20 mmol/L — ABNORMAL LOW (ref 22–32)
Calcium: 8.4 mg/dL — ABNORMAL LOW (ref 8.9–10.3)
Chloride: 106 mmol/L (ref 98–111)
Creatinine, Ser: 0.63 mg/dL (ref 0.44–1.00)
GFR, Estimated: >60 mL/min (ref >60)
Glucose, Bld: 106 mg/dL — ABNORMAL HIGH (ref 70–99)
Potassium: 3.4 mmol/L — ABNORMAL LOW (ref 3.5–5.1)
Sodium: 136 mmol/L (ref 135–145)
Total Bilirubin: 0.5 mg/dL (ref 0.0–1.2)
Total Protein: 6 g/dL — ABNORMAL LOW (ref 6.5–8.1)

## 2023-09-29 LAB — ACETAMINOPHEN LEVEL
Acetaminophen (Tylenol), Serum: <10 ug/mL — ABNORMAL LOW (ref 10–30)
Acetaminophen (Tylenol), Serum: <10 ug/mL — ABNORMAL LOW (ref 10–30)

## 2023-09-29 LAB — HCG, SERUM, QUALITATIVE: Preg, Serum: POSITIVE — AB

## 2023-09-29 LAB — CBG MONITORING, ED: Glucose-Capillary: 112 mg/dL — ABNORMAL HIGH (ref 70–99)

## 2023-09-29 LAB — SALICYLATE LEVEL: Salicylate Lvl: <7 mg/dL — ABNORMAL LOW (ref 7.0–30.0)

## 2023-09-29 LAB — ETHANOL: Alcohol, Ethyl (B): <10 mg/dL (ref <10)

## 2023-09-29 MED ORDER — LAMOTRIGINE 100 MG PO TABS
100.0000 mg | ORAL_TABLET | Freq: Every day | ORAL | Status: DC
  Administered 2023-09-30 – 2023-10-03 (×4): 100 mg via ORAL
  Filled 2023-09-29 (×6): qty 1

## 2023-09-29 MED ORDER — PRENATAL MULTIVITAMIN CH
1.0000 | ORAL_TABLET | Freq: Every day | ORAL | Status: DC
  Administered 2023-09-30 – 2023-10-03 (×4): 1 via ORAL
  Filled 2023-09-29 (×6): qty 1

## 2023-09-29 MED ORDER — LAMOTRIGINE 100 MG PO TABS: 100.0000 mg | ORAL_TABLET | Freq: Every day | ORAL | Status: DC

## 2023-09-29 MED ORDER — CHARCOAL ACTIVATED PO LIQD
75.0000 g | Freq: Once | ORAL | Status: AC
  Administered 2023-09-29: 75 g via ORAL
  Filled 2023-09-29: qty 480

## 2023-09-29 MED ORDER — PRENATAL MULTIVITAMIN CH
1.0000 | ORAL_TABLET | Freq: Every day | ORAL | Status: DC
  Administered 2023-09-29: 1 via ORAL
  Filled 2023-09-29 (×2): qty 1

## 2023-09-29 MED ORDER — HYDROXYZINE HCL 25 MG PO TABS
25.0000 mg | ORAL_TABLET | ORAL | Status: AC
  Administered 2023-09-29: 25 mg via ORAL
  Filled 2023-09-29 (×2): qty 1

## 2023-09-29 MED ORDER — SODIUM CHLORIDE 0.9 % IV BOLUS (SEPSIS)
1000.0000 mL | Freq: Once | INTRAVENOUS | Status: AC
  Administered 2023-09-29: 1000 mL via INTRAVENOUS

## 2023-09-29 MED ORDER — MELATONIN 3 MG PO TABS: 3.0000 mg | ORAL_TABLET | Freq: Every day | ORAL | Status: DC

## 2023-09-29 MED ORDER — ALUM & MAG HYDROXIDE-SIMETH 200-200-20 MG/5ML PO SUSP: 30.0000 mL | ORAL | Status: DC | PRN

## 2023-09-29 MED ORDER — ACETAMINOPHEN 325 MG PO TABS: 650.0000 mg | ORAL_TABLET | Freq: Four times a day (QID) | ORAL | Status: DC | PRN

## 2023-09-29 NOTE — ED Notes (Signed)
 Patient dress and gray wallet given to patient's  grandmother.

## 2023-09-29 NOTE — ED Triage Notes (Signed)
 Patient is [redacted] weeks pregnant due in June 2025

## 2023-09-29 NOTE — Progress Notes (Signed)
 Spoke with Dr Despina Hidden who was already aware of pt.  Updated him that repeat EKG was normal and that we are awaiting repeat labs at this time.  MD says that pt will not need further monitoring after she is medically cleared, and that the ED will need to contact psych for their evaluation.  ED physician made aware.

## 2023-09-29 NOTE — Progress Notes (Signed)
 Received call from [facility] regarding patient arriving via EMS for medication overdose. Patient took 20 tablets of 25mg  Lamictal for attempt to commit suicide.   38- FHT monitors placed  0542- Spoke with OB Attending, advised the above and current orders placed by ED provider. Continue continuous FHM and update if/as status changes.   49- Spoke with ED provider, patient is to be monitored in ED for 4 hours total per poison control and if no change or decline in status, be cleared. Spoke with OB Attending, advised this information. Continue FHM while patient is in ED.   0720- Gave handoff to dayshift RROB  Lovenia Shuck, RN
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
## 2023-09-29 NOTE — Progress Notes (Signed)
 Pt has been accepted to [facility] on 09/29/2023 Bed assignment: 400-2   Pt meets inpatient criteria per: [name] NP  Attending Physician will be: Dr. Phineas Inches, MD   Report can be called to: Adult unit: [id number]  Pt can arrive CONSENT   Care Team Notified: [facility] Rona Ravens RN, [name] NP, [place] Noe Fiscus RN  [place] Herman Mell LCSW-A   09/29/2023 3:24 PM

## 2023-09-29 NOTE — ED Notes (Signed)
Called safe transport  
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
## 2023-09-29 NOTE — [facility] Group Notes (Signed)
 [facility] Group Notes:  (Nursing/MHT/Case Management/Adjunct)  Date:  09/29/2023  Time:  9:46 PM  Type of Therapy:   Wrap-up group  Participation Level:  Did Not Attend  Participation Quality:    Affect:    Cognitive:    Insight:    Engagement in Group:    Modes of Intervention:    Summary of Progress/Problems: Pt didn't attend group.   Taylor Brennan 09/29/2023, 9:46 PM

## 2023-09-29 NOTE — ED Provider Notes (Signed)
  Physical Exam  BP 104/64   Pulse (!) 105   Temp 98.3 F (36.8 C) (Tympanic)   Resp (!) 22   Ht 5\' 1"  (1.549 m)   Wt 85.7 kg   LMP 03/28/2023 (Approximate)   SpO2 94%   BMI 35.71 kg/m   Physical Exam  Procedures  Procedures  ED Course / MDM   Clinical Course as of 09/29/23 0935  Tue Sep 29, 2023  0525 Patient was seen on her level for overdose.  She reports she took lamotrigine within the past hour.  She reports it was 20-25 mg tablets, however she has been prescribed lamotrigine 100 mg tablets recently.  I consulted with [place] [facility].  We discussed this case extensively. If it is possible that she took 100 mg tablets, they would recommend 75 g of activated charcoal given the level of toxicity.  Activated charcoal is pregnancy safe.  Patient is awake and alert and protecting her airway, will order activated charcoal [DW]  0526 [place] poison control also recommends at least 4-hour monitoring with an EKG and repeat Tylenol level at the 4-hour mark.  If there are no acute changes and patient's mental status appropriate she can be medically cleared [DW]  0601 Labs overall unremarkable. Continue to monitor.  Mental status is appropriate Patient is currently being monitored by OB/GYN, no acute issues. [DW]  [phone] Patient was able to still do the activated charcoal.  She is awake and alert. Plan per OB, they plan to continue fetal monitoring for the next several hours. Will need to reassess patient at the end of 4-hour [DW]  [phone] Signed out to dr Andria Meuse at shift change - f/u on repeat EKG/APAP levels and consult OB for further fetal monitoring [DW]    Clinical Course User Index [DW] Zadie Rhine, MD   Medical Decision Making Amount and/or Complexity of Data Reviewed Labs: ordered.  Risk OTC drugs.   Wardell Honour, have assumed care for this patient.  In brief this is a 23 year old female presenting to the emergency room after a lamotrigine ingestion.   This was an attempt at self-harm.  Patient signed out pending repeat EKG, Tylenol levels.  Patient's EKG did not show a widened QRS.  She had a repeat Tylenol level that was undetectable.  Spoke with our rapid response OB nurse, and they discussed with their attending.  They do not require any additional fetal monitoring.  Patient is now medically cleared for TTS evaluation.  I presume this may be a bit more complicated given the patient's current pregnancy.       Anders Simmonds T, DO 09/29/23 [phone]

## 2023-09-29 NOTE — Progress Notes (Signed)
 Pts tylenol level is wnl and ED physician clears pt medically.  Pt can now be removed from fetal heart rate monitors.  Dr Despina Hidden made aware.
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
## 2023-09-29 NOTE — Consult Note (Signed)
 Patient name: Taylor Brennan MRN [id number]  Date of birth: Oct 06, 2000 Chief Complaint:   Drug Overdose  History of Present Illness:   Taylor Brennan is a 23 y.o. G72P2002 female at [redacted]w[redacted]d with an Estimated Date of Delivery: 12/28/23 being seen today for ongoing management of a high-risk pregnancy complicated by suicide attmept last evening taking 400 mg of lamictal.  She has a history of bipolar 1 disorder.    She has been monitored extensively earlier today and the FHR strip is reassuring No other obstetrical issues at this point Agree with disposition of IP management with [facility]     09/23/2023   11:55 AM 08/13/2023    1:33 PM 08/10/2023    4:25 PM 07/16/2023    1:27 PM 06/15/2023    1:11 PM  Depression screen PHQ 2/9  Decreased Interest   2    Down, Depressed, Hopeless   1    PHQ - 2 Score   3    Altered sleeping   0    Tired, decreased energy   1    Change in appetite   3    Feeling bad or failure about yourself    0    Trouble concentrating   2    Moving slowly or fidgety/restless   1    Suicidal thoughts   2    PHQ-9 Score   12    Difficult doing work/chores          Information is confidential and restricted. Go to Review Flowsheets to unlock data.        09/23/2023   11:56 AM 08/13/2023    1:32 PM 08/10/2023    4:26 PM 07/16/2023    1:29 PM  GAD 7 : Generalized Anxiety Score  Nervous, Anxious, on Edge   1   Control/stop worrying   2   Worry too much - different things   0   Trouble relaxing   1   Restless   0   Easily annoyed or irritable   2   Afraid - awful might happen   2   Total GAD 7 Score   8   Anxiety Difficulty         Information is confidential and restricted. Go to Review Flowsheets to unlock data.     Review of Systems:   Pertinent items are noted in HPI Denies abnormal vaginal discharge w/ itching/odor/irritation, headaches, visual changes, shortness of breath, chest pain, abdominal pain, severe nausea/vomiting, or problems with urination or bowel  movements unless otherwise stated above. Pertinent History Reviewed:  Reviewed past medical,surgical, social, obstetrical and family history.  Reviewed problem list, medications and allergies. Physical Assessment:   Vitals:   09/29/23 0830 09/29/23 0900 09/29/23 0930 09/29/23 1000  BP: 109/70 104/64 116/68 107/70  Pulse: (!) 106 (!) 105 100 (!) 113  Resp: (!) 21 (!) 22 (!) 30 (!) 21  Temp:      TempSrc:      SpO2: 98% 94% 97% 98%  Weight:      Height:      Body mass index is 35.71 kg/m.                Fetal Status:          Fetal Surveillance Testing today: reassuring FHR tracing , discussed with Alma Downs, RN , rapid response nurse from [facility] earlier today  Chaperone:     Results for orders placed or performed during the hospital encounter  of 09/29/23 (from the past 24 hours)  Comprehensive metabolic panel   Collection Time: 09/29/23  5:18 AM  Result Value Ref Range   Sodium 136 135 - 145 mmol/L   Potassium 3.4 (L) 3.5 - 5.1 mmol/L   Chloride 106 98 - 111 mmol/L   CO2 20 (L) 22 - 32 mmol/L   Glucose, Bld 106 (H) 70 - 99 mg/dL   BUN 5 (L) 6 - 20 mg/dL   Creatinine, Ser 1.61 0.44 - 1.00 mg/dL   Calcium 8.4 (L) 8.9 - 10.3 mg/dL   Total Protein 6.0 (L) 6.5 - 8.1 g/dL   Albumin 2.7 (L) 3.5 - 5.0 g/dL   AST 16 15 - 41 U/L   ALT 11 0 - 44 U/L   Alkaline Phosphatase 54 38 - 126 U/L   Total Bilirubin 0.5 0.0 - 1.2 mg/dL   GFR, Estimated >09 >60 mL/min   Anion gap 10 5 - 15  CBC WITH DIFFERENTIAL   Collection Time: 09/29/23  5:18 AM  Result Value Ref Range   WBC 9.9 4.0 - 10.5 K/uL   RBC 3.42 (L) 3.87 - 5.11 MIL/uL   Hemoglobin 11.5 (L) 12.0 - 15.0 g/dL   HCT 45.4 (L) 09.8 - 11.9 %   MCV 98.8 80.0 - 100.0 fL   MCH 33.6 26.0 - 34.0 pg   MCHC 34.0 30.0 - 36.0 g/dL   RDW 14.7 82.9 - 56.2 %   Platelets 215 150 - 400 K/uL   nRBC 0.0 0.0 - 0.2 %   Neutrophils Relative % 59 %   Neutro Abs 5.9 1.7 - 7.7 K/uL   Lymphocytes Relative 33 %   Lymphs Abs 3.2 0.7 - 4.0 K/uL    Monocytes Relative 6 %   Monocytes Absolute 0.6 0.1 - 1.0 K/uL   Eosinophils Relative 1 %   Eosinophils Absolute 0.1 0.0 - 0.5 K/uL   Basophils Relative 0 %   Basophils Absolute 0.0 0.0 - 0.1 K/uL   Immature Granulocytes 1 %   Abs Immature Granulocytes 0.07 0.00 - 0.07 K/uL  Salicylate level   Collection Time: 09/29/23  5:30 AM  Result Value Ref Range   Salicylate Lvl <7.0 (L) 7.0 - 30.0 mg/dL  Acetaminophen level   Collection Time: 09/29/23  5:30 AM  Result Value Ref Range   Acetaminophen (Tylenol), Serum <10 (L) 10 - 30 ug/mL  Ethanol   Collection Time: 09/29/23  5:30 AM  Result Value Ref Range   Alcohol, Ethyl (B) <10 <10 mg/dL  CBG monitoring, ED   Collection Time: 09/29/23  5:45 AM  Result Value Ref Range   Glucose-Capillary 112 (H) 70 - 99 mg/dL  Urine rapid drug screen (hosp performed)   Collection Time: 09/29/23  8:26 AM  Result Value Ref Range   Opiates NONE DETECTED NONE DETECTED   Cocaine NONE DETECTED NONE DETECTED   Benzodiazepines NONE DETECTED NONE DETECTED   Amphetamines NONE DETECTED NONE DETECTED   Tetrahydrocannabinol NONE DETECTED NONE DETECTED   Barbiturates NONE DETECTED NONE DETECTED  Acetaminophen level   Collection Time: 09/29/23  8:30 AM  Result Value Ref Range   Acetaminophen (Tylenol), Serum <10 (L) 10 - 30 ug/mL  hCG, serum, qualitative   Collection Time: 09/29/23 10:12 AM  Result Value Ref Range   Preg, Serum POSITIVE (A) NEGATIVE    Assessment & Plan:  High-risk pregnancy: [id number] at [redacted]w[redacted]d with an Estimated Date of Delivery: 12/28/23      ICD-10-CM   1. Intentional overdose,  initial encounter [facility])  T50.902A        Reassuring fetal status  Meds:  Meds ordered this encounter  Medications   charcoal activated (NO SORBITOL) (ACTIDOSE-AQUA) suspension 75 g   sodium chloride 0.9 % bolus 1,000 mL   lamoTRIgine (LAMICTAL) tablet 100 mg   prenatal multivitamin tablet 1 tablet    Orders:  Orders Placed This Encounter  Procedures    Critical Care   Comprehensive metabolic panel   Salicylate level   Acetaminophen level   Ethanol   Urine rapid drug screen (hosp performed)   CBC WITH DIFFERENTIAL   Acetaminophen level   hCG, serum, qualitative   Diet regular Room service appropriate? Yes; Fluid consistency: Thin   ED Cardiac monitoring   Initiate Carrier Fluid Protocol   Vital signs   Notify physician (specify)   Pharmacy Tech to prioritize PTA Med Rec   Place in ED Psych Observation hold   Full code   Consult to TTS   Consult to obstetrics / gynecology   CBG monitoring, ED   ED EKG   EKG   Precautions Type: Suicide; C-SSRS Risk Category: High Risk; Level of Care as indicated by C-SSRS Risk Category: High Risk Category: Initiate Suicide Precautions to include 1:1 monitoring   Place in psych hold     Labs/procedures today: see above  Treatment Plan:  agree with her IP disposition, no further OB management is needed other than routine [facility], she has an appt with Dr Marice Potter which is listed below on 10/17/23   Follow-up:    Future Appointments  Date Time Provider Department Center  09/30/2023  2:20 PM Gerre Scull, NP [facility]  10/06/2023  1:50 PM Allie Bossier, MD [facility] None    Orders Placed This Encounter  Procedures   Critical Care   Comprehensive metabolic panel   Salicylate level   Acetaminophen level   Ethanol   Urine rapid drug screen (hosp performed)   CBC WITH DIFFERENTIAL   Acetaminophen level   hCG, serum, qualitative   Diet regular Room service appropriate? Yes; Fluid consistency: Thin   ED Cardiac monitoring   Initiate Carrier Fluid Protocol   Vital signs   Notify physician (specify)   Pharmacy Tech to prioritize PTA Med Rec   Place in ED Psych Observation hold   Full code   Consult to TTS   Consult to obstetrics / gynecology   CBG monitoring, ED   ED EKG   EKG   Precautions Type: Suicide; C-SSRS Risk Category: High Risk; Level of Care as indicated by C-SSRS Risk Category:  High Risk Category: Initiate Suicide Precautions to include 1:1 monitoring   Place in psych hold   Lazaro Arms  Attending Physician for the Center for [facility] 09/29/2023 2:30 PM

## 2023-09-29 NOTE — ED Triage Notes (Signed)
 Patient brought in by EMS from home. Patient has taken 20 25mg  Lamictal tablets with an attempt to commit suicide. Patient is alert and oriented x4, No acute respiratory or cardiac distress at this time.

## 2023-09-29 NOTE — ED Notes (Signed)
 Attempted to walk patient to bathroom., however she felt dizzy, Patient transferred to bathroom via wheelchair. Tolerated well. Urine collected.

## 2023-09-29 NOTE — ED Notes (Signed)
 Poison control called for update on pt, provided by this RN.

## 2023-09-30 ENCOUNTER — Encounter: Payer: MEDICAID | Admitting: Nurse Practitioner

## 2023-09-30 ENCOUNTER — Telehealth: Payer: Self-pay

## 2023-09-30 ENCOUNTER — Other Ambulatory Visit: Payer: Self-pay

## 2023-09-30 ENCOUNTER — Encounter (HOSPITAL_COMMUNITY): Payer: Self-pay | Admitting: Nurse Practitioner

## 2023-09-30 ENCOUNTER — Encounter (HOSPITAL_COMMUNITY): Payer: Self-pay

## 2023-09-30 DIAGNOSIS — F39 Unspecified mood [affective] disorder: Principal | ICD-10-CM

## 2023-09-30 MED ORDER — HYDROXYZINE HCL 25 MG PO TABS
25.0000 mg | ORAL_TABLET | Freq: Three times a day (TID) | ORAL | Status: DC | PRN
  Administered 2023-09-30 – 2023-10-02 (×3): 25 mg via ORAL
  Filled 2023-09-30 (×3): qty 1

## 2023-09-30 MED ORDER — OLANZAPINE 10 MG IM SOLR: 10.0000 mg | Freq: Three times a day (TID) | INTRAMUSCULAR | Status: DC | PRN

## 2023-09-30 MED ORDER — OLANZAPINE 10 MG IM SOLR: 5.0000 mg | Freq: Three times a day (TID) | INTRAMUSCULAR | Status: DC | PRN

## 2023-09-30 MED ORDER — OLANZAPINE 5 MG PO TBDP: 5.0000 mg | ORAL_TABLET | Freq: Three times a day (TID) | ORAL | Status: DC | PRN

## 2023-09-30 MED ORDER — DOXYLAMINE SUCCINATE (SLEEP) 25 MG PO TABS
25.0000 mg | ORAL_TABLET | Freq: Every evening | ORAL | Status: DC | PRN
  Filled 2023-09-30: qty 1

## 2023-09-30 NOTE — Progress Notes (Signed)
   09/30/23 0900  Psych Admission Type (Psych Patients Only)  Admission Status Voluntary  Psychosocial Assessment  Patient Complaints None  Eye Contact Fair  Facial Expression Anxious  Affect Anxious;Flat  Speech Logical/coherent  Interaction Assertive  Motor Activity Other (Comment) (WNL)  Appearance/Hygiene In scrubs  Behavior Characteristics Anxious;Cooperative  Mood Anxious;Apprehensive  Thought Process  Coherency WDL  Content WDL  Delusions None reported or observed  Perception WDL  Hallucination None reported or observed  Judgment Limited  Confusion None  Danger to Self  Current suicidal ideation? Denies  Agreement Not to Harm Self Yes  Description of Agreement verbal  Danger to Others  Danger to Others None reported or observed

## 2023-09-30 NOTE — [facility] Group Notes (Signed)
 [facility] Group Notes:  (Nursing/MHT/Case Management/Adjunct)  Date:  09/30/2023  Time:  8:38 PM  Type of Therapy:   Wrap-up group  Participation Level:  Active  Participation Quality:  Appropriate  Affect:  Appropriate  Cognitive:  Appropriate  Insight:  Appropriate  Engagement in Group:  Engaged  Modes of Intervention:  Education  Summary of Progress/Problems: Goal to stay calm and be positive. Met goal. Rated day 9/10.  Taylor Brennan 09/30/2023, 8:38 PM

## 2023-09-30 NOTE — [facility] Group Notes (Signed)
 Spirituality Group Focus of discussion: Gratitude and Strength Awareness  Process: Following theoretical framework of group therapy of Chyrl Civatte and further informed by Rogerian and Relational Cultural Theory approaches, participants invited to name:  Sources of gratitude (internal>external) Articulate gratitude for self Name a personal strength/gift/skill Locate points of resonance among group members/engage the "here and now" Conclude with grounding/breathwork  Observations: Taylor Brennan was quiet and reserved but still an engaged group participant.  Taylor Brennan L. Taylor Brennan, M.Div [phone]

## 2023-09-30 NOTE — BH IP Treatment Plan (Signed)
 Interdisciplinary Treatment and Diagnostic Plan Update  09/30/2023 Time of Session: 1035 Taylor Brennan MRN: [id number]  Principal Diagnosis: Bipolar 1 disorder, depressed (HCC)  Secondary Diagnoses: Principal Problem:   Bipolar 1 disorder, depressed (HCC)   Current Medications:  Current Facility-Administered Medications  Medication Dose Route Frequency Provider Last Rate Last Admin   acetaminophen (TYLENOL) tablet 650 mg  650 mg Oral Q6H PRN [name], NP       alum & mag hydroxide-simeth (MAALOX/MYLANTA) 200-200-20 MG/5ML suspension 30 mL  30 mL Oral Q4H PRN [name], NP       lamoTRIgine (LAMICTAL) tablet 100 mg  100 mg Oral Daily [name], NP   100 mg at 09/30/23 0753   prenatal multivitamin tablet 1 tablet  1 tablet Oral Daily [name], NP   1 tablet at 09/30/23 0753   PTA Medications: Medications Prior to Admission  Medication Sig Dispense Refill Last Dose/Taking   Doxylamine-Pyridoxine (DICLEGIS) 10-10 MG TBEC Take 2 tablets by mouth daily. Start with 2 delayed-release tablets by mouth on a daily basis at bedtime If symptoms not adequately controlled, increase dose to 4 tablets each day (1 tab in AM, 1 tab mid-afternoon, and 2 tabs at bedtime) (Patient not taking: Reported on 09/29/2023) 60 tablet 1    hydrOXYzine (ATARAX) 10 MG tablet Take 1 tablet (10 mg total) by mouth 3 (three) times daily as needed. (Patient taking differently: Take 10 mg by mouth 3 (three) times daily as needed for anxiety.) 90 tablet 3    lamoTRIgine (LAMICTAL) 100 MG tablet Take 1 tablet (100 mg total) by mouth daily. Take one tablet daily 30 tablet 3    Prenatal Vit-Fe Fumarate-FA (PRENATAL VITAMIN) 27-0.8 MG TABS Take 1 tablet by mouth daily. 30 tablet 12    terconazole (TERAZOL 7) 0.4 % vaginal cream Place 1 applicator vaginally at bedtime. 45 g 0     Patient Stressors: Marital or family conflict   Medication change or noncompliance    Patient Strengths: Capable of  independent living  General fund of knowledge  Physical Health  Supportive family/friends   Treatment Modalities: Medication Management, Group therapy, Case management,  1 to 1 session with clinician, Psychoeducation, Recreational therapy.   Physician Treatment Plan for Primary Diagnosis: Bipolar 1 disorder, depressed (HCC) Long Term Goal(s):     Short Term Goals:    Medication Management: Evaluate patient's response, side effects, and tolerance of medication regimen.  Therapeutic Interventions: 1 to 1 sessions, Unit Group sessions and Medication administration.  Evaluation of Outcomes: Not Progressing  Physician Treatment Plan for Secondary Diagnosis: Principal Problem:   Bipolar 1 disorder, depressed (HCC)  Long Term Goal(s):     Short Term Goals:       Medication Management: Evaluate patient's response, side effects, and tolerance of medication regimen.  Therapeutic Interventions: 1 to 1 sessions, Unit Group sessions and Medication administration.  Evaluation of Outcomes: Not Progressing   RN Treatment Plan for Primary Diagnosis: Bipolar 1 disorder, depressed (HCC) Long Term Goal(s): Knowledge of disease and therapeutic regimen to maintain health will improve  Short Term Goals: Ability to remain free from injury will improve, Ability to participate in decision making will improve, Ability to verbalize feelings will improve, Ability to identify and develop effective coping behaviors will improve, and Compliance with prescribed medications will improve  Medication Management: RN will administer medications as ordered by provider, will assess and evaluate patient's response and provide education to patient for prescribed medication. RN will report any adverse and/or  side effects to prescribing provider.  Therapeutic Interventions: 1 on 1 counseling sessions, Psychoeducation, Medication administration, Evaluate responses to treatment, Monitor vital signs and CBGs as ordered,  Perform/monitor CIWA, COWS, AIMS and Fall Risk screenings as ordered, Perform wound care treatments as ordered.  Evaluation of Outcomes: Not Progressing   LCSW Treatment Plan for Primary Diagnosis: Bipolar 1 disorder, depressed (HCC) Long Term Goal(s): Safe transition to appropriate next level of care at discharge, Engage patient in therapeutic group addressing interpersonal concerns.  Short Term Goals: Engage patient in aftercare planning with referrals and resources, Increase social support, Increase ability to appropriately verbalize feelings, Increase emotional regulation, and Increase skills for wellness and recovery  Therapeutic Interventions: Assess for all discharge needs, 1 to 1 time with Social worker, Explore available resources and support systems, Assess for adequacy in community support network, Educate family and significant other(s) on suicide prevention, Complete Psychosocial Assessment, Interpersonal group therapy.  Evaluation of Outcomes: Not Progressing   Progress in Treatment: Attending groups: No. Participating in groups: No. and As evidenced by:  admitted 14 hours ago Taking medication as prescribed: Yes. Toleration medication: Yes. Family/Significant other contact made: No, will contact:  CSW assessment & consents pending Patient understands diagnosis: Yes. Discussing patient identified problems/goals with staff: Yes. Medical problems stabilized or resolved: Yes. Denies suicidal/homicidal ideation: Yes. Issues/concerns per patient self-inventory: No. Other: N/a  New problem(s) identified: No, Describe:  None  New Short Term/Long Term Goal(s): medication stabilization, elimination of SI thoughts, development of comprehensive mental wellness plan.    Patient Goals:  "Change my medication for one"  Discharge Plan or Barriers: Patient recently admitted. CSW will continue to follow and assess for appropriate referrals and possible discharge planning.    Reason  for Continuation of Hospitalization: Depression Medication stabilization Other; describe Mood stabilization, discharge planning  Estimated Length of Stay: 5-7 DAYS  Last 3 [place] Suicide Severity Risk Score: Flowsheet Row [facility] Most recent reading at 09/30/2023 12:11 AM ED from 09/29/2023 in [facility] Emergency Department at [facility] Most recent reading at 09/29/2023  5:31 AM Video Visit from 09/23/2023 in [facility] Most recent reading at 09/23/2023 11:55 AM  C-SSRS RISK CATEGORY High Risk High Risk Error: Question 6 not populated       Last [facility] 2/9 Scores:    09/23/2023   11:55 AM 08/13/2023    1:33 PM 08/10/2023    4:25 PM  Depression screen PHQ 2/9  Decreased Interest 0 2 2  Down, Depressed, Hopeless 0 2 1  PHQ - 2 Score 0 4 3  Altered sleeping 3 2 0  Tired, decreased energy 2 2 1   Change in appetite 1 0 3  Feeling bad or failure about yourself  1 2 0  Trouble concentrating 0 2 2  Moving slowly or fidgety/restless 2 2 1   Suicidal thoughts 0 0 2  PHQ-9 Score 9 14 12   Difficult doing work/chores  Very difficult     Scribe for Treatment Team: Jacinta Shoe, LCSW 09/30/2023 10:11 AM

## 2023-09-30 NOTE — Tx Team (Signed)
 Initial Treatment Plan 09/30/2023 2:31 AM Taylor Brennan JYN:[id number]    PATIENT STRESSORS: Marital or family conflict   Medication change or noncompliance     PATIENT STRENGTHS: Capable of independent living  General fund of knowledge  Physical Health  Supportive family/friends    PATIENT IDENTIFIED PROBLEMS: Ineffective coping skills  Family Dynamics                   DISCHARGE CRITERIA:  Ability to meet basic life and health needs Improved stabilization in mood, thinking, and/or behavior Reduction of life-threatening or endangering symptoms to within safe limits Safe-care adequate arrangements made  PRELIMINARY DISCHARGE PLAN: Outpatient therapy Return to previous living arrangement  PATIENT/FAMILY INVOLVEMENT: This treatment plan has been presented to and reviewed with the patient, [facility]  The patient and family have been given the opportunity to ask questions and make suggestions.  Gara Kroner, RN 09/30/2023, 2:31 AM
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
## 2023-09-30 NOTE — Plan of Care (Signed)
   Problem: Education: Goal: Knowledge of [facility] Education information/materials will improve Outcome: Progressing Goal: Emotional status will improve Outcome: Progressing Goal: Mental status will improve Outcome: Progressing Goal: Verbalization of understanding the information provided will improve Outcome: Progressing
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
## 2023-09-30 NOTE — Progress Notes (Signed)
   09/30/23 2200  Psych Admission Type (Psych Patients Only)  Admission Status Voluntary  Psychosocial Assessment  Patient Complaints Anxiety  Eye Contact Fair  Facial Expression Anxious  Affect Anxious;Flat  Speech Logical/coherent  Interaction Assertive  Motor Activity Other (Comment) (WNL)  Appearance/Hygiene In scrubs  Behavior Characteristics Anxious;Cooperative  Mood Pleasant;Euthymic  Aggressive Behavior  Targets  (None)  Type of Behavior  (None)  Effect No apparent injury  Thought Process  Coherency WDL  Content WDL  Delusions None reported or observed  Perception WDL  Hallucination None reported or observed  Judgment Limited  Confusion None  Danger to Self  Current suicidal ideation? Denies  Description of Suicide Plan None  Agreement Not to Harm Self Yes  Description of Agreement Verbal agreement for safety  Danger to Others  Danger to Others None reported or observed

## 2023-09-30 NOTE — Group Note (Signed)
 Recreation Therapy Group Note   Group Topic:Communication  Group Date: 09/30/2023 Start Time: 0935 End Time: 1012 Facilitators: Taylor Brennan, LRT,CTRS Location: [address] Dayroom  Group Topic: Communication, Problem Solving   Goal Area(s) Addresses:  Patient will effectively listen to complete activity.  Patient will identify communication skills used to make activity successful.  Patient will identify how skills used during activity can be used to reach post d/c goals.    Intervention: Building surveyor Activity - Geometric pattern cards, pencils, blank paper    Activity: Geometric Drawings.  Three volunteers from the peer group will be shown an abstract picture with a particular arrangement of geometrical shapes.  Each round, one 'speaker' will describe the pattern, as accurately as possible without revealing the image to the group.  The remaining group members will listen and draw the picture to reflect how it is described to them. Patients with the role of 'listener' cannot ask clarifying questions but, may request that the speaker repeat a direction. Once the drawings are complete, the presenter will show the rest of the group the picture and compare how close each person came to drawing the picture. LRT will facilitate a post-activity discussion regarding effective communication and the importance of planning, listening, and asking for clarification in daily interactions with others.  Education: Environmental consultant, Active listening, Support systems, Discharge planning  Education Outcome: Acknowledges understanding/In group clarification offered/Needs additional education.    Affect/Mood: N/A   Participation Level: Did not attend    Clinical Observations/Individualized Feedback:      Plan: Continue to engage patient in RT group sessions 2-3x/week.   Taylor Brennan, LRT,CTRS 09/30/2023 12:36 PM

## 2023-09-30 NOTE — Group Note (Signed)
 Date:  09/30/2023 Time:  4:48 PM  Group Topic/Focus:  Dimensions of Wellness:   The focus of this group is to introduce the topic of wellness and discuss the role each dimension of wellness plays in total health.    Participation Level:  Active  Participation Quality:  Appropriate  Affect:  Appropriate  Cognitive:  Appropriate  Insight: Appropriate  Engagement in Group:  Engaged  Modes of Intervention:  Activity and Education  Additional Comments:   Pt attended the Wellness group and completed the Dimension of Wellness activity.  Edmund Hilda Odalys Win 09/30/2023, 4:48 PM

## 2023-09-30 NOTE — Transitions of Care (Post Inpatient/ED Visit) (Signed)
   09/30/2023  Name: Davianna Deutschman MRN: [id number] DOB: 2000/12/04  Today's TOC FU Call Status: Today's TOC FU Call Status:: Unsuccessful Call (1st Attempt) Unsuccessful Call (1st Attempt) Date: 09/30/23  Attempted to reach the patient regarding the most recent Inpatient/ED visit.  Follow Up Plan: Additional outreach attempts will be made to reach the patient to complete the Transitions of Care (Post Inpatient/ED visit) call.   Signature  Jodelle Green, RMA

## 2023-09-30 NOTE — Plan of Care (Signed)
  Problem: Education: Goal: Knowledge of [facility] Education information/materials will improve 09/30/2023 0959 by Thersa Salt, RN Outcome: Progressing 09/30/2023 0959 by Thersa Salt, RN Outcome: Progressing Goal: Emotional status will improve 09/30/2023 0959 by Thersa Salt, RN Outcome: Progressing 09/30/2023 0959 by Thersa Salt, RN Outcome: Progressing Goal: Mental status will improve 09/30/2023 0959 by Thersa Salt, RN Outcome: Progressing 09/30/2023 0959 by Thersa Salt, RN Outcome: Progressing Goal: Verbalization of understanding the information provided will improve 09/30/2023 0959 by Thersa Salt, RN Outcome: Progressing 09/30/2023 0959 by Thersa Salt, RN Outcome: Progressing   Problem: Activity: Goal: Interest or engagement in activities will improve Outcome: Progressing Goal: Sleeping patterns will improve Outcome: Progressing
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
## 2023-09-30 NOTE — Group Note (Signed)
 Date:  09/30/2023 Time:  4:25 PM  Group Topic/Focus:  Goals Group:   The focus of this group is to help patients establish daily goals to achieve during treatment and discuss how the patient can incorporate goal setting into their daily lives to aide in recovery. Orientation:   The focus of this group is to educate the patient on the purpose and policies of crisis stabilization and provide a format to answer questions about their admission.  The group details unit policies and expectations of patients while admitted.    Participation Level:  Did Not Attend  Participation Quality:   n/a  Affect:   n/a  Cognitive:   n/a  Insight: None  Engagement in Group:   n/a  Modes of Intervention:   n/a  Additional Comments:   Did not attend  Talita Recht M Drake Wuertz 09/30/2023, 4:25 PM

## 2023-10-01 DIAGNOSIS — F39 Unspecified mood [affective] disorder: Secondary | ICD-10-CM | POA: Diagnosis not present

## 2023-10-01 NOTE — Plan of Care (Signed)
  Problem: Education: Goal: Knowledge of [place] [facility] Education information/materials will improve Outcome: Completed/Met Goal: Emotional status will improve Outcome: Progressing Goal: Mental status will improve Outcome: Progressing Goal: Verbalization of understanding the information provided will improve Outcome: Progressing

## 2023-10-01 NOTE — Group Note (Signed)
 LCSW Group Therapy Note   Group Date: 10/01/2023 Start Time: 1100 End Time: 1200   Participation:  did not attend    Type of Therapy:  Group Therapy  Topic:  Stronger Together: Building Healthy Relationships  Objective:  To explore loneliness, boundaries, and safe ways to build relationships.  Goals: Recognize healthy vs. unhealthy relationships. Learn safe ways to connect with others. Strengthen communication and [employer].  Summary:  Participants discussed loneliness, healthy connections, and setting boundaries. They explored safe ways to meet people and shared personal experiences. Key insights were reinforced through discussion and quotes.  Therapeutic Modalities Used: Elements of Cognitive Behavioral Therapy (CBT) - Identifying unhealthy relationship patterns, challenging negative thoughts about connection. Elements of Dialectical Behavior Therapy (DBT) - Interpersonal effectiveness, setting and maintaining boundaries. Supportive Group Therapy - Peer discussion, shared experiences, and emotional validation.   Alla Feeling, LCSWA 10/01/2023  1:54 PM
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
## 2023-10-01 NOTE — [facility] Suicide Risk Assessment (Signed)
 [facility] INPATIENT:  Family/Significant Other Suicide Prevention Education  Suicide Prevention Education:  Education Completed; [place], boyfriend [phone],  (name of family member/significant other) has been identified by the patient as the family member/significant other with whom the patient will be residing, and identified as the person(s) who will aid the patient in the event of a mental health crisis (suicidal ideations/suicide attempt).  With written consent from the patient, the family member/significant other has been provided the following suicide prevention education, prior to the and/or following the discharge of the patient.  The suicide prevention education provided includes the following: Suicide risk factors Suicide prevention and interventions National Suicide Hotline telephone number [facility] assessment telephone number [facility] Emergency Assistance [facility] and/or Residential Mobile Crisis Unit telephone number  Request made of family/significant other to: Remove weapons (e.g., guns, rifles, knives), all items previously/currently identified as safety concern.   Remove drugs/medications (over-the-counter, prescriptions, illicit drugs), all items previously/currently identified as a safety concern.  The family member/significant other verbalizes understanding of the suicide prevention education information provided.  The family member/significant other agrees to remove the items of safety concern listed above.  Kathi Der 10/01/2023, 4:08 PM

## 2023-10-01 NOTE — Progress Notes (Signed)
   10/01/23 0900  Psych Admission Type (Psych Patients Only)  Admission Status Voluntary  Psychosocial Assessment  Patient Complaints None  Eye Contact Fair  Facial Expression Anxious  Affect Flat  Speech Logical/coherent  Interaction Assertive  Motor Activity Other (Comment) (WNL)  Appearance/Hygiene In scrubs  Behavior Characteristics Cooperative  Mood Pleasant  Thought Process  Coherency WDL  Content WDL  Delusions None reported or observed  Perception WDL  Hallucination None reported or observed  Judgment Limited  Confusion None  Danger to Self  Current suicidal ideation? Denies  Agreement Not to Harm Self Yes  Description of Agreement verbal  Danger to Others  Danger to Others None reported or observed

## 2023-10-01 NOTE — [facility] Group Notes (Signed)
 [facility] Group Notes:  (Nursing/MHT/Case Management/Adjunct)  Date:  10/01/2023  Time:  2000  Type of Therapy:   Wrap up group  Participation Level:  Active  Participation Quality:  Appropriate, Attentive, Sharing, and Supportive  Affect:  Appropriate  Cognitive:  Alert  Insight:  Improving  Engagement in Group:  Engaged  Modes of Intervention:  Clarification, Education, and Support  Summary of Progress/Problems: Positive thinking and positive change were discussed.   Marcille Buffy 10/01/2023, 8:40 PM

## 2023-10-02 DIAGNOSIS — F39 Unspecified mood [affective] disorder: Secondary | ICD-10-CM | POA: Diagnosis not present

## 2023-10-02 MED ORDER — WHITE PETROLATUM EX OINT
TOPICAL_OINTMENT | CUTANEOUS | Status: AC
Start: 2023-10-02 — End: 2023-10-02
  Filled 2023-10-02: qty 5

## 2023-10-02 NOTE — [facility] Group Notes (Signed)
 Adult Psychoeducational Group Note  Date:  10/02/2023 Time:  9:55 PM  Group Topic/Focus:  Wrap-Up Group:   The focus of this group is to help patients review their daily goal of treatment and discuss progress on daily workbooks.  Participation Level:  Active  Participation Quality:  Appropriate  Affect:  Appropriate  Cognitive:  Appropriate  Insight: Appropriate  Engagement in Group:  Engaged  Modes of Intervention:  Discussion and Support  Additional Comments:  Pt attended the evening AA group.  Christ Kick 10/02/2023, 9:55 PM

## 2023-10-02 NOTE — Transitions of Care (Post Inpatient/ED Visit) (Signed)
   10/02/2023  Name: Taylor Brennan MRN: [id number] DOB: 2001-03-29  Today's TOC FU Call Status: Today's TOC FU Call Status:: Unsuccessful Call (2nd Attempt) Unsuccessful Call (1st Attempt) Date: 09/30/23 Unsuccessful Call (2nd Attempt) Date: 09/04/23  Attempted to reach the patient regarding the most recent Inpatient/ED visit.  Follow Up Plan: Additional outreach attempts will be made to reach the patient to complete the Transitions of Care (Post Inpatient/ED visit) call.   Signature Jenny Reichmann

## 2023-10-02 NOTE — Plan of Care (Signed)
   Problem: Education: Goal: Emotional status will improve Outcome: Progressing Goal: Mental status will improve Outcome: Progressing Goal: Verbalization of understanding the information provided will improve Outcome: Progressing

## 2023-10-02 NOTE — Progress Notes (Signed)
   10/01/23 2130  Psych Admission Type (Psych Patients Only)  Admission Status Voluntary  Psychosocial Assessment  Patient Complaints None  Eye Contact Fair  Facial Expression Animated  Affect Appropriate to circumstance  Speech Logical/coherent  Interaction Assertive  Motor Activity Other (Comment) (WNL)  Appearance/Hygiene Unremarkable  Behavior Characteristics Cooperative;Appropriate to situation  Mood Pleasant  Thought Process  Coherency WDL  Content WDL  Delusions None reported or observed  Perception WDL  Hallucination None reported or observed  Judgment Limited  Confusion None  Danger to Self  Current suicidal ideation? Denies  Agreement Not to Harm Self Yes  Description of Agreement verbal  Danger to Others  Danger to Others None reported or observed

## 2023-10-02 NOTE — Group Note (Signed)
 Recreation Therapy Group Note   Group Topic:Stress Management  Group Date: 10/02/2023 Start Time: 8657 End Time: 1002 Facilitators: Kerrigan Gombos-McCall, LRT,CTRS Location: [address] Hall Dayroom   Group Topic: Stress Management  Goal Area(s) Addresses:  Patient will identify positive stress management techniques. Patient will identify benefits of using stress management post d/c.  Intervention: Insight Timer App  Activity: LRT played a meditation that focused on not holding yourself back. Patients were to visualize what they saw for their future selves. Patients were to then acknowledge the things they feel are holding them back, accept them and work through them to be person they want themselves to be. LRT also explained to patients they could find more meditations on Youtube, apps, scripts, etc.    Education:  Stress Management, Discharge Planning.   Education Outcome: Acknowledges Education   Affect/Mood: Flat   Participation Level: None   Participation Quality: None   Modes of Intervention: Meditation   Patient Response to Interventions:  None   Education Outcome:  In group clarification offered    Clinical Observations/Individualized Feedback: Pt was called out of group by doctor as group was starting and didn't return.    Plan: Continue to engage patient in RT group sessions 2-3x/week.   Vihaan Gloss-McCall, LRT,CTRS 10/02/2023 12:28 PM

## 2023-10-02 NOTE — Progress Notes (Signed)
   10/02/23 0804  Psych Admission Type (Psych Patients Only)  Admission Status Voluntary  Psychosocial Assessment  Patient Complaints None  Eye Contact Fair  Facial Expression Animated  Affect Appropriate to circumstance  Speech Logical/coherent  Interaction Assertive  Motor Activity Other (Comment) (WDL)  Appearance/Hygiene Unremarkable  Behavior Characteristics Cooperative;Appropriate to situation  Mood Pleasant  Thought Process  Coherency WDL  Content WDL  Delusions None reported or observed  Perception WDL  Hallucination None reported or observed  Judgment Limited  Confusion None  Danger to Self  Current suicidal ideation? Denies  Agreement Not to Harm Self Yes  Description of Agreement Verbal  Danger to Others  Danger to Others None reported or observed
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
## 2023-10-02 NOTE — Progress Notes (Signed)
 Acknowledging request to provide assistance with HCPOA. Unable to fulfill that this afternoon, and may not be able to provide this before Monday. Please page a chaplain if support needed over weekend. Toneisha Savary L. Sophronia Simas, M.Div [phone]

## 2023-10-02 NOTE — Plan of Care (Signed)
  Problem: Coping: Goal: Ability to verbalize frustrations and anger appropriately will improve Outcome: Progressing   Problem: Safety: Goal: Periods of time without injury will increase Outcome: Progressing   Problem: Education: Goal: Emotional status will improve Outcome: Progressing

## 2023-10-02 NOTE — Plan of Care (Signed)
   Problem: Education: Goal: Emotional status will improve Outcome: Progressing Goal: Mental status will improve Outcome: Progressing   Problem: Activity: Goal: Interest or engagement in activities will improve Outcome: Progressing Goal: Sleeping patterns will improve Outcome: Progressing   Problem: Safety: Goal: Periods of time without injury will increase Outcome: Progressing

## 2023-10-03 DIAGNOSIS — F39 Unspecified mood [affective] disorder: Secondary | ICD-10-CM | POA: Diagnosis not present

## 2023-10-03 MED ORDER — DOXYLAMINE SUCCINATE (SLEEP) 25 MG PO TABS: 25.0000 mg | ORAL_TABLET | Freq: Every evening | ORAL | 0 refills | Status: DC | PRN

## 2023-10-03 MED ORDER — LAMOTRIGINE 100 MG PO TABS: 100.0000 mg | ORAL_TABLET | Freq: Every day | ORAL | 0 refills | Status: DC

## 2023-10-03 MED ORDER — HYDROXYZINE HCL 25 MG PO TABS: 25.0000 mg | ORAL_TABLET | Freq: Three times a day (TID) | ORAL | 0 refills | Status: DC | PRN

## 2023-10-03 MED ORDER — DOXYLAMINE-PYRIDOXINE 10-10 MG PO TBEC: 2.0000 | DELAYED_RELEASE_TABLET | Freq: Every day | ORAL | Status: DC

## 2023-10-03 NOTE — [facility] Suicide Risk Assessment (Signed)
 [facility] Discharge Suicide Risk Assessment   Principal Problem: Unspecified mood (affective) disorder (HCC) Discharge Diagnoses: Principal Problem:   Unspecified mood (affective) disorder (HCC) Active Problems:   Cannabis use disorder, mild, in early remission   Total Time spent with patient: 20 minutes  Taylor Brennan is a 23 y.o., female who is currently [redacted] weeks pregnant with past psychiatric history of Bipolar 1 disorder and cannabis use disorder who presents to the [facility] Voluntary from [facility] Emergency Department for evaluation and management after a suicidal gesture.   During the patient's hospitalization, patient had extensive initial psychiatric evaluation, and follow-up psychiatric evaluations every day.   Psychiatric diagnoses provided upon initial assessment:  Unspecified mood disorder. Mdd vs Bipolar disorder type 2 R/o impulse control disorder  Cannabis use disorder in early remission    Patient's psychiatric medications were adjusted on admission:  -Continue Home Lamictal 100 mg daily for mood stabilization (pt did not swallow any of the lamotrigine tablets)    During the hospitalization, other adjustments were made to the patient's psychiatric medication regimen: none    Patient's care was discussed during the interdisciplinary team meeting every day during the hospitalization.   The patient denied having side effects to prescribed psychiatric medication.   Gradually, patient started adjusting to milieu. The patient was evaluated each day by a clinical provider to ascertain response to treatment. Improvement was noted by the patient's report of decreasing symptoms, improved sleep and appetite, affect, medication tolerance, behavior, and participation in unit programming.  Patient was asked each day to complete a self inventory noting mood, mental status, pain, new symptoms, anxiety and concerns.     Symptoms were reported as significantly decreased  or resolved completely by discharge.    On day of discharge, the patient reports that their mood is stable. The patient denied having suicidal thoughts for more than 48 hours prior to discharge.  Patient denies having homicidal thoughts.  Patient denies having auditory hallucinations.  Patient denies any visual hallucinations or other symptoms of psychosis. The patient was motivated to continue taking medication with a goal of continued improvement in mental health.    The patient reports their target psychiatric symptoms of impulsivity and suicidal thoughts, all responded well to the psychiatric medications, and the patient reports overall benefit other psychiatric hospitalization. Supportive psychotherapy was provided to the patient. The patient also participated in regular group therapy while hospitalized. Coping skills, problem solving as well as relaxation therapies were also part of the unit programming.   Labs were reviewed with the patient, and abnormal results were discussed with the patient.   The patient is able to verbalize their individual safety plan to this provider.   # It is recommended to the patient to continue psychiatric medications as prescribed, after discharge from the hospital.     # It is recommended to the patient to follow up with your outpatient psychiatric provider and PCP.   # It was discussed with the patient, the impact of alcohol, drugs, tobacco have been there overall psychiatric and medical wellbeing, and total abstinence from substance use was recommended the patient.ed.   # Prescriptions provided or sent directly to preferred pharmacy at discharge. Patient agreeable to plan. Given opportunity to ask questions. Appears to feel comfortable with discharge.    # In the event of worsening symptoms, the patient is instructed to call the crisis hotline, 911 and or go to the nearest ED for appropriate evaluation and treatment of symptoms. To follow-up with  primary care  provider for other medical issues, concerns and or health care needs   # Patient was discharged to home, with boyfriend, SW did safety planning with boyfriend including having him secure medications at home, with a plan to follow up as noted below.    Psychiatric Specialty Exam  Presentation  General Appearance:  Appropriate for Environment; Casual; Fairly Groomed  Eye Contact: Good  Speech: Normal Rate; Clear and Coherent  Speech Volume: Normal  Handedness: Right   Mood and Affect  Mood: Euthymic  Duration of Depression Symptoms: Greater than two weeks  Affect: Appropriate; Congruent; Full Range   Thought Process  Thought Processes: Linear  Descriptions of Associations:Intact  Orientation:Full (Time, Place and Person)  Thought Content:Logical  History of Schizophrenia/Schizoaffective disorder:No  Duration of Psychotic Symptoms:No data recorded Hallucinations:Hallucinations: None  Ideas of Reference:None  Suicidal Thoughts:Suicidal Thoughts: No  Homicidal Thoughts:Homicidal Thoughts: No   Sensorium  Memory: Immediate Good; Recent Good; Remote Good  Judgment: Good  Insight: Good   Executive Functions  Concentration: Good  Attention Span: Good  Recall: Good  Fund of Knowledge: Good  Language: Good   Psychomotor Activity  Psychomotor Activity: Psychomotor Activity: Normal   Assets  Assets: Communication Skills; Desire for Improvement; Housing; Intimacy; Social Lawyer; Other (comment) (lives and cares for children, religious beliefs)   Sleep  Sleep: Sleep: Fair Number of Hours of Sleep: 7   Physical Exam: Physical Exam See discharge summary  ROS See discharge summary    Blood pressure 111/70, pulse (!) 105, temperature 98.2 F (36.8 C), temperature source Oral, resp. rate 20, height 5\' 1"  (1.549 m), weight 85.7 kg, last menstrual period 03/28/2023, SpO2 100%, not currently breastfeeding. Body mass  index is 35.7 kg/m.  Mental Status Per Nursing Assessment::   On Admission:  NA  Demographic factors:  Adolescent or young adult, Unemployed, Low socioeconomic status Loss Factors:  Decrease in vocational status, Financial problems / change in socioeconomic status Historical Factors:  Impulsivity Risk Reduction Factors:  Pregnancy, Sense of responsibility to family, Responsible for children under 37 years of age, Living with another person, especially a relative  Continued Clinical Symptoms:  Mood is stable. Anxiety at a manageable level. Denying any SI including passive SI.    Cognitive Features That Contribute To Risk:  None    Suicide Risk:  Mild:  There are no identifiable suicide plans, no associated intent, mild dysphoria and related symptoms, good self-control (both objective and subjective assessment), few other risk factors, and identifiable protective factors, including available and accessible social support.    Follow-up Information     [facility] Follow up on 10/13/2023.   Specialty: Behavioral Health Why: You have an appointment for medication management services on 10/13/23 at 3:00 pm with Dr. Doyne Keel.  You also have an appointment for therapy services on 11/16/23 at  9:00 am.  * Please make sure to cancel if you cannot attend. Contact information: [address] [place] [address] [phone]                Plan Of Care/Follow-up recommendations:   -Follow-up with your outpatient psychiatric provider -instructions on appointment date, time, and address (location) are provided to you in discharge paperwork.   -Take your psychiatric medications as prescribed at discharge - instructions are provided to you in the discharge paperwork   -Follow-up with outpatient primary care doctor and other specialists -for management of preventative medicine and chronic medical disease   -If you  are prescribed an atypical antipsychotic  medication, we recommend that your outpatient psychiatrist follow routine screening for side effects within 3 months of discharge, including monitoring: AIMS scale, height, weight, blood pressure, fasting lipid panel, HbA1c, and fasting blood sugar.    -Recommend total abstinence from alcohol, tobacco, and other illicit drug use at discharge.    -If your psychiatric symptoms recur, worsen, or if you have side effects to your psychiatric medications, call your outpatient psychiatric provider, 911, [phone] or go to the nearest emergency department.   -If suicidal thoughts occur, immediately call your outpatient psychiatric provider, 911, 988 or go to the nearest emergency department.   Phineas Inches, MD 10/03/2023, 9:43 AM

## 2023-10-03 NOTE — Progress Notes (Signed)
  [facility] Adult Case Management Discharge Plan :  Will you be returning to the same living situation after discharge:  Yes,  Patient will be returning home with significant other. At discharge, do you have transportation home?: Yes,  Significant other will provide transport home Do you have the ability to pay for your medications: Yes,  patient states that she has Medicaid  Release of information consent forms completed and in the chart;  Patient's signature needed at discharge.  Patient to Follow up at:  Follow-up Information     [facility] Follow up on 10/13/2023.   Specialty: Behavioral Health Why: You have an appointment for medication management services on 10/13/23 at 3:00 pm with Dr. Doyne Keel.  You also have an appointment for therapy services on 11/16/23 at  9:00 am.  * Please make sure to cancel if you cannot attend. Contact information: [address] [place] [address] [phone]                Next level of care provider has access to [facility] Link:yes  Safety Planning and Suicide Prevention discussed: Yes,  completed on 10/01/23     Has patient been referred to the Quitline?: Patient does not use tobacco/nicotine products  Patient has been referred for addiction treatment: No known substance use disorder.  [address] [place], [place] 10/03/2023, 9:04 AM

## 2023-10-03 NOTE — Progress Notes (Signed)
   10/02/23 2000  Psych Admission Type (Psych Patients Only)  Admission Status Voluntary  Psychosocial Assessment  Patient Complaints Anxiety  Eye Contact Fair  Facial Expression Animated  Affect Appropriate to circumstance  Speech Logical/coherent  Interaction Assertive  Motor Activity Slow  Appearance/Hygiene Unremarkable  Behavior Characteristics Cooperative;Appropriate to situation  Mood Pleasant  Thought Process  Coherency WDL  Content WDL  Delusions None reported or observed  Perception WDL  Hallucination None reported or observed  Judgment Limited  Confusion None  Danger to Self  Current suicidal ideation? Denies  Agreement Not to Harm Self Yes  Description of Agreement verbal  Danger to Others  Danger to Others None reported or observed   Patient is pleasant interacting well with Peers and Staff compliant with treatment plan. Denies SI.HI.A.VH and verbally contracts for safety. Prn Vistaril given at [facility] for anxiety. No adverse effects noted. Q 15 minutes safety checks ongoing. Patient remains safe.
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
## 2023-10-03 NOTE — Progress Notes (Signed)
 Patient discharged to home accompanied by friend.  Discharge instructions, all required discharge documents and information about follow-up appointment given to pt with verbalization of understanding. No belongings was brought to hospital on admission. Pt escorted to lobby by RN  at 1220.

## 2023-10-03 NOTE — Discharge Instructions (Signed)

## 2023-10-04 ENCOUNTER — Other Ambulatory Visit: Payer: Self-pay | Admitting: Obstetrics & Gynecology

## 2023-10-06 ENCOUNTER — Ambulatory Visit: Payer: MEDICAID | Admitting: Obstetrics & Gynecology

## 2023-10-06 VITALS — BP 119/74 | HR 106 | Wt 191.3 lb

## 2023-10-06 DIAGNOSIS — O99210 Obesity complicating pregnancy, unspecified trimester: Secondary | ICD-10-CM

## 2023-10-06 DIAGNOSIS — Z3480 Encounter for supervision of other normal pregnancy, unspecified trimester: Secondary | ICD-10-CM

## 2023-10-06 DIAGNOSIS — Z3A28 28 weeks gestation of pregnancy: Secondary | ICD-10-CM | POA: Diagnosis not present

## 2023-10-06 DIAGNOSIS — O99343 Other mental disorders complicating pregnancy, third trimester: Secondary | ICD-10-CM

## 2023-10-06 DIAGNOSIS — F314 Bipolar disorder, current episode depressed, severe, without psychotic features: Secondary | ICD-10-CM

## 2023-10-06 DIAGNOSIS — T1491XA Suicide attempt, initial encounter: Secondary | ICD-10-CM

## 2023-10-06 DIAGNOSIS — Z23 Encounter for immunization: Secondary | ICD-10-CM | POA: Diagnosis not present

## 2023-10-06 NOTE — Progress Notes (Signed)
 Pt presents for rob. Pt states that she is still having symptoms of yeast. Pt. Declines swab, states that she already knows that it is yeast.

## 2023-10-06 NOTE — Progress Notes (Signed)
   PRENATAL VISIT NOTE  Subjective:  Taylor Brennan is a 23 y.o. G3P2002 at [redacted]w[redacted]d being seen today for ongoing prenatal care.  She is currently monitored for the following issues for this high-risk pregnancy and has Bipolar 1 disorder, depressed, severe (HCC); Cannabis use disorder, mild, in early remission; Suicide attempt (HCC); Migraine without aura and without status migrainosus, not intractable; Supervision of other normal pregnancy, antepartum; PTSD (post-traumatic stress disorder); Obesity affecting pregnancy, antepartum; Bipolar 1 disorder, depressed (HCC); and Unspecified mood (affective) disorder (HCC) on their problem list.  Patient reports that she "knows" that she has another yeast infection.  In lab results, I see one positive yeast and 1 positive BV test. She declines an exam or swab. Contractions: Not present. Vag. Bleeding: None.  Movement: Present. Denies leaking of fluid.   The following portions of the patient's history were reviewed and updated as appropriate: allergies, current medications, past family history, past medical history, past social history, past surgical history and problem list.   Objective:   Vitals:   10/06/23 1347  BP: 119/74  Pulse: (!) 106  Weight: 191 lb 4.8 oz (86.8 kg)    Fetal Status: Fetal Heart Rate (bpm): 134 Fundal Height: 29 cm Movement: Present     General:  Alert, oriented and cooperative. Patient is in no acute distress.  Skin: Skin is warm and dry. No rash noted.   Cardiovascular: Normal heart rate noted  Respiratory: Normal respiratory effort, no problems with respiration noted  Abdomen: Soft, gravid, appropriate for gestational age.  Pain/Pressure: Absent     Pelvic: Cervical exam deferred        Extremities: Normal range of motion.  Edema: None  Mental Status: Normal mood and affect. Normal behavior. Normal judgment and thought content.   Assessment and Plan:  Pregnancy: G3P2002 at [redacted]w[redacted]d 1. Bipolar 1 disorder, depressed, severe  (HCC) (Primary) - stable  2. Supervision of other normal pregnancy, antepartum - TDAP - We discussed ACOG recs for spacing pregnancies along with contraception options.    3. Obesity affecting pregnancy, antepartum, unspecified obesity type - has gained 11 pounds in pregnancy  4. [redacted] weeks gestation of pregnancy - she missed her appt for labs so will come back next week for 2 hour GTT and labs  5. Suicide attempt [facility]) - Recently discharged, stable at present  6. Vaginal itching- she declines testing. I suggested Monistat 7 prn.  Preterm labor symptoms and general obstetric precautions including but not limited to vaginal bleeding, contractions, leaking of fluid and fetal movement were reviewed in detail with the patient. Please refer to After Visit Summary for other counseling recommendations.   Return in about 1 week (around 10/13/2023) for just for 28 week labs.  Future Appointments  Date Time Provider Department Center  10/13/2023  3:00 PM Taylor Cisco, NP [facility] None  10/20/2023  1:30 PM Brennan, Taylor Floor, CNM [facility] None  11/16/2023  9:00 AM Brennan, Taylor Rhymes, LCSW [facility] None    Taylor Bossier, MD

## 2023-10-13 ENCOUNTER — Ambulatory Visit (INDEPENDENT_AMBULATORY_CARE_PROVIDER_SITE_OTHER): Payer: MEDICAID | Admitting: Psychiatry

## 2023-10-13 ENCOUNTER — Encounter (HOSPITAL_COMMUNITY): Payer: Self-pay | Admitting: Psychiatry

## 2023-10-13 ENCOUNTER — Other Ambulatory Visit: Payer: MEDICAID

## 2023-10-13 VITALS — BP 117/68 | HR 100 | Temp 98.2°F | Resp 18 | Ht 61.0 in | Wt 190.4 lb

## 2023-10-13 DIAGNOSIS — F39 Unspecified mood [affective] disorder: Secondary | ICD-10-CM

## 2023-10-13 DIAGNOSIS — F5105 Insomnia due to other mental disorder: Secondary | ICD-10-CM | POA: Diagnosis not present

## 2023-10-13 DIAGNOSIS — F411 Generalized anxiety disorder: Secondary | ICD-10-CM | POA: Diagnosis not present

## 2023-10-13 MED ORDER — LAMOTRIGINE 100 MG PO TABS: 100.0000 mg | ORAL_TABLET | Freq: Every day | ORAL | 3 refills | Status: DC

## 2023-10-13 MED ORDER — DOXYLAMINE SUCCINATE (SLEEP) 25 MG PO TABS: 25.0000 mg | ORAL_TABLET | Freq: Every evening | ORAL | 3 refills | Status: DC | PRN
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
## 2023-10-20 ENCOUNTER — Encounter: Payer: Self-pay | Admitting: Advanced Practice Midwife

## 2023-10-20 ENCOUNTER — Ambulatory Visit: Payer: MEDICAID | Admitting: Advanced Practice Midwife

## 2023-10-20 VITALS — BP 124/71 | HR 101 | Wt 192.2 lb

## 2023-10-20 DIAGNOSIS — O99210 Obesity complicating pregnancy, unspecified trimester: Secondary | ICD-10-CM

## 2023-10-20 DIAGNOSIS — J302 Other seasonal allergic rhinitis: Secondary | ICD-10-CM | POA: Diagnosis not present

## 2023-10-20 DIAGNOSIS — Z3480 Encounter for supervision of other normal pregnancy, unspecified trimester: Secondary | ICD-10-CM

## 2023-10-20 MED ORDER — FLUTICASONE PROPIONATE 50 MCG/ACT NA SUSP: 1.0000 | Freq: Every day | NASAL | 2 refills | Status: DC

## 2023-10-20 MED ORDER — CETIRIZINE HCL 10 MG PO TABS: 10.0000 mg | ORAL_TABLET | Freq: Every day | ORAL | 2 refills | Status: DC

## 2023-10-20 NOTE — Progress Notes (Signed)
 Pt presents for ROB visit. Seen at [facility]. Pt c/o seasonal allergies. Pt missed GTT.

## 2023-10-20 NOTE — Progress Notes (Signed)
   PRENATAL VISIT NOTE  Subjective:  Taylor Brennan is a 23 y.o. G3P2002 at [redacted]w[redacted]d being seen today for ongoing prenatal care.  She is currently monitored for the following issues for this low-risk pregnancy and has Bipolar 1 disorder, depressed, severe (HCC); Cannabis use disorder, mild, in early remission; Suicide attempt (HCC); Migraine without aura and without status migrainosus, not intractable; Supervision of other normal pregnancy, antepartum; PTSD (post-traumatic stress disorder); Obesity affecting pregnancy, antepartum; Bipolar 1 disorder, depressed (HCC); and Unspecified mood (affective) disorder (HCC) on their problem list.  Patient reports  seasonal allergies, itchy watery eyes, nasal congestion .  Contractions: Irritability. Vag. Bleeding: None.  Movement: Present. Denies leaking of fluid.   The following portions of the patient's history were reviewed and updated as appropriate: allergies, current medications, past family history, past medical history, past social history, past surgical history and problem list.   Objective:   Vitals:   10/20/23 1333  BP: 124/71  Pulse: (!) 101  Weight: 192 lb 3.2 oz (87.2 kg)    Fetal Status: Fetal Heart Rate (bpm): 141   Movement: Present     General:  Alert, oriented and cooperative. Patient is in no acute distress.  Skin: Skin is warm and dry. No rash noted.   Cardiovascular: Normal heart rate noted  Respiratory: Normal respiratory effort, no problems with respiration noted  Abdomen: Soft, gravid, appropriate for gestational age.  Pain/Pressure: Absent     Pelvic: Cervical exam deferred        Extremities: Normal range of motion.  Edema: Trace  Mental Status: Normal mood and affect. Normal behavior. Normal judgment and thought content.   Assessment and Plan:  Pregnancy: G3P2002 at [redacted]w[redacted]d 1. Supervision of other normal pregnancy, antepartum (Primary) --Anticipatory guidance about next visits/weeks of pregnancy given.  --Pt missed GTT  visit, reschedule lab only visit before next appt  2. Obesity affecting pregnancy, antepartum, unspecified obesity type   3. Seasonal allergies  - fluticasone (FLONASE) 50 MCG/ACT nasal spray; Place 1 spray into both nostrils daily.  Dispense: 15.8 mL; Refill: 2 - cetirizine (ZYRTEC ALLERGY) 10 MG tablet; Take 1 tablet (10 mg total) by mouth daily.  Dispense: 30 tablet; Refill: 2   Preterm labor symptoms and general obstetric precautions including but not limited to vaginal bleeding, contractions, leaking of fluid and fetal movement were reviewed in detail with the patient. Please refer to After Visit Summary for other counseling recommendations.   No follow-ups on file.  Future Appointments  Date Time Provider Department Center  10/27/2023  1:50 PM Gabrielle Joiner, MD [facility] None  11/16/2023  9:00 AM Cozart, Angel Barba, LCSW [facility] None  12/10/2023  2:30 PM Arlyne Bering, NP [facility] None    Arlester Bence, CNM

## 2023-10-27 ENCOUNTER — Encounter: Payer: MEDICAID | Admitting: Obstetrics

## 2023-11-03 ENCOUNTER — Encounter: Payer: MEDICAID | Admitting: Obstetrics

## 2023-11-04 ENCOUNTER — Encounter: Payer: Self-pay | Admitting: Obstetrics and Gynecology

## 2023-11-04 ENCOUNTER — Ambulatory Visit: Payer: MEDICAID | Admitting: Obstetrics and Gynecology

## 2023-11-04 ENCOUNTER — Other Ambulatory Visit: Payer: MEDICAID

## 2023-11-04 VITALS — BP 118/69 | HR 100 | Wt 192.8 lb

## 2023-11-04 DIAGNOSIS — Z3480 Encounter for supervision of other normal pregnancy, unspecified trimester: Secondary | ICD-10-CM | POA: Diagnosis not present

## 2023-11-04 DIAGNOSIS — O99210 Obesity complicating pregnancy, unspecified trimester: Secondary | ICD-10-CM

## 2023-11-04 DIAGNOSIS — Z3A32 32 weeks gestation of pregnancy: Secondary | ICD-10-CM

## 2023-11-04 NOTE — Progress Notes (Signed)
 Pt present for ROB visit. C/o pelvic pain on the left side.

## 2023-11-04 NOTE — Progress Notes (Signed)
   PRENATAL VISIT NOTE  Subjective:  Taylor Brennan is a 23 y.o. G3P2002 at [redacted]w[redacted]d being seen today for ongoing prenatal care.  She is currently monitored for the following issues for this low-risk pregnancy and has Bipolar 1 disorder, depressed, severe (HCC); Cannabis use disorder, mild, in early remission; Suicide attempt (HCC); Migraine without aura and without status migrainosus, not intractable; Supervision of other normal pregnancy, antepartum; PTSD (post-traumatic stress disorder); Obesity affecting pregnancy, antepartum; Bipolar 1 disorder, depressed (HCC); and Unspecified mood (affective) disorder (HCC) on their problem list.  Patient reports  left abdominal/ pelvic pain, coming and going, denies bleeding has tried heating pad .  Contractions: Not present. Vag. Bleeding: None.  Movement: Present. Denies leaking of fluid.   The following portions of the patient's history were reviewed and updated as appropriate: allergies, current medications, past family history, past medical history, past social history, past surgical history and problem list.   Objective:   Vitals:   11/04/23 1004  BP: 118/69  Pulse: 100  Weight: 192 lb 12.8 oz (87.5 kg)    Fetal Status: Fetal Heart Rate (bpm): 132 Fundal Height: 3 cm Movement: Present     General:  Alert, oriented and cooperative. Patient is in no acute distress.  Skin: Skin is warm and dry. No rash noted.   Cardiovascular: Normal heart rate noted  Respiratory: Normal respiratory effort, no problems with respiration noted  Abdomen: Soft, gravid, appropriate for gestational age.  Pain/Pressure: Absent     Pelvic: Cervical exam deferred        Extremities: Normal range of motion.  Edema: Trace  Mental Status: Normal mood and affect. Normal behavior. Normal judgment and thought content.   Assessment and Plan:  Pregnancy: G3P2002 at [redacted]w[redacted]d 1. Supervision of other normal pregnancy, antepartum (Primary) BP and FHR normal Doing well, feeling  regular movement    2. [redacted] weeks gestation of pregnancy Supportive measures regarding pain, discussed if continue or worsen can do PT Precuations provided when to go to hospital   3. Obesity affecting pregnancy, antepartum, unspecified obesity type   Preterm labor symptoms and general obstetric precautions including but not limited to vaginal bleeding, contractions, leaking of fluid and fetal movement were reviewed in detail with the patient. Please refer to After Visit Summary for other counseling recommendations.   Return in about 2 weeks (around 11/18/2023) for OB VISIT (MD or APP).  Future Appointments  Date Time Provider Department Center  11/16/2023  9:00 AM Cozart, Angel Barba, LCSW [facility] None  12/10/2023  2:30 PM Arlyne Bering, NP [facility] None    Susi Eric, FNP

## 2023-11-06 LAB — RPR, QUANT+TP ABS (REFLEX)
Rapid Plasma Reagin, Quant: 1:1 {titer} — ABNORMAL HIGH
T Pallidum Abs: NONREACTIVE

## 2023-11-06 LAB — RPR: RPR Ser Ql: REACTIVE — AB

## 2023-11-06 LAB — GLUCOSE TOLERANCE, 2 HOURS W/ 1HR
Glucose, 1 hour: 151 mg/dL (ref 70–179)
Glucose, 2 hour: 120 mg/dL (ref 70–152)
Glucose, Fasting: 79 mg/dL (ref 70–91)

## 2023-11-06 LAB — CBC
Hematocrit: 34 % (ref 34.0–46.6)
Hemoglobin: 11.8 g/dL (ref 11.1–15.9)
MCH: 34.1 pg — ABNORMAL HIGH (ref 26.6–33.0)
MCHC: 34.7 g/dL (ref 31.5–35.7)
MCV: 98 fL — ABNORMAL HIGH (ref 79–97)
Platelets: 219 10*3/uL (ref 150–450)
RBC: 3.46 x10E6/uL — ABNORMAL LOW (ref 3.77–5.28)
RDW: 12 % (ref 11.7–15.4)
WBC: 7.6 10*3/uL (ref 3.4–10.8)

## 2023-11-06 LAB — HIV ANTIBODY (ROUTINE TESTING W REFLEX): HIV Screen 4th Generation wRfx: NONREACTIVE

## 2023-11-09 ENCOUNTER — Other Ambulatory Visit: Payer: Self-pay

## 2023-11-09 ENCOUNTER — Encounter (HOSPITAL_COMMUNITY): Payer: Self-pay

## 2023-11-09 ENCOUNTER — Emergency Department (HOSPITAL_COMMUNITY)
Admission: EM | Admit: 2023-11-09 | Discharge: 2023-11-09 | Disposition: A | Discharge status: Home / Self Care | Payer: MEDICAID | Attending: Emergency Medicine | Admitting: Emergency Medicine

## 2023-11-09 DIAGNOSIS — R899 Unspecified abnormal finding in specimens from other organs, systems and tissues: Secondary | ICD-10-CM | POA: Diagnosis present

## 2023-11-09 DIAGNOSIS — R000 Tachycardia, unspecified: Secondary | ICD-10-CM | POA: Diagnosis not present

## 2023-11-09 NOTE — ED Triage Notes (Signed)
 Pt tested positive for syphilis 2 weeks ago at [facility]. Pt states she was told it was a false positive. Pt wants to ensure she does not have syphilis. Pt is [redacted] weeks pregnant, denies any symptoms

## 2023-11-09 NOTE — ED Provider Notes (Signed)
 [place] EMERGENCY DEPARTMENT AT [facility] Provider Note   CSN: [id number] Arrival date & time: 11/09/23  1732     History  Chief Complaint  Patient presents with   Exposure to STD    Taylor Brennan is a 23 y.o. female who is 8 months pregnant presents with concern for an abnormal syphilis test that was obtained at her OB/GYN.  She denies any known exposure to syphilis or any symptoms such as fevers, rash.  She denies any concern for other STIs.  Denies any vaginal bleeding or abdominal pain.   Exposure to STD       Home Medications Prior to Admission medications   Medication Sig Start Date End Date Taking? Authorizing Provider  cetirizine  (ZYRTEC  ALLERGY) 10 MG tablet Take 1 tablet (10 mg total) by mouth daily. 10/20/23   Leftwich-Kirby, Darren Em, CNM  doxylamine , Sleep, (UNISOM ) 25 MG tablet Take 1 tablet (25 mg total) by mouth at bedtime as needed for sleep. 10/13/23   Arlyne Bering, NP  Doxylamine -Pyridoxine  (DICLEGIS ) 10-10 MG TBEC Take 2 tablets by mouth daily. Start with 2 delayed-release tablets by mouth on a daily basis at bedtime If symptoms not adequately controlled, increase dose to 4 tablets each day (1 tab in AM, 1 tab mid-afternoon, and 2 tabs at bedtime) Patient not taking: Reported on 10/06/2023 10/03/23   Eleanore Grey, MD  fluticasone  (FLONASE ) 50 MCG/ACT nasal spray Place 1 spray into both nostrils daily. Patient not taking: Reported on 11/04/2023 10/20/23   Arlester Bence A, CNM  lamoTRIgine  (LAMICTAL ) 100 MG tablet Take 1 tablet (100 mg total) by mouth daily. 10/13/23   Arlyne Bering, NP  Prenatal Vit-Fe Fumarate-FA (PRENATAL VITAMIN) 27-0.8 MG TABS Take 1 tablet by mouth daily. 04/29/23   Henderly, Britni A, PA-C  terconazole  (TERAZOL 7 ) 0.4 % vaginal cream Place 1 applicator vaginally at bedtime. Patient not taking: Reported on 10/06/2023 08/18/23   Asher Lawn, CNM      Allergies    Patient has no known allergies.     Review of Systems   Review of Systems  Constitutional:  Negative for fever.  Skin:  Negative for rash.    Physical Exam Updated Vital Signs BP 126/79   Pulse (!) 110   Temp 98.4 F (36.9 C) (Oral)   Resp 17   Ht 5\' 1"  (1.549 m)   Wt 87.1 kg   LMP 03/28/2023 (Approximate)   SpO2 96%   BMI 36.28 kg/m  Physical Exam Vitals and nursing note reviewed.  Constitutional:      Appearance: Normal appearance.  HENT:     Head: Atraumatic.  Cardiovascular:     Rate and Rhythm: Regular rhythm. Tachycardia present.  Pulmonary:     Effort: Pulmonary effort is normal.  Skin:    Comments: Dry patch of skin on left forearm, no other skin changes.  No rash on palms or soles  Neurological:     General: No focal deficit present.     Mental Status: She is alert.  Psychiatric:        Mood and Affect: Mood normal.        Behavior: Behavior normal.     ED Results / Procedures / Treatments   Labs (all labs ordered are listed, but only abnormal results are displayed) Labs Reviewed - No data to display  EKG None  Radiology No results found.  Procedures Procedures    Medications Ordered in ED Medications - No data to display  ED Course/ Medical Decision Making/ A&P                                 Medical Decision Making  Differential diagnosis includes but is not limited to gonorrhea, chlamydia, syphilis, HIV, hepatitis  ED Course:  Upon initial evaluation, patient is well-appearing.  Stable vitals aside from mild tachycardia that has been consistent throughout her pregnancy.  She had a RPR that resulted abnormal which was taken 6 days ago, but with nonreactive Treponema pallidum antibodies.  Denies any exposure to syphilis.  No symptoms or rash.  I discussed this case with my attending Dr. Nolia Baumgartner who also reviewed her labs.  Dr. Nolia Baumgartner  recommends having patient follow-up with her OB/GYN in the next 2 to 4 weeks for repeat RPR and further management.  She does not recommend  any prophylactic treatment at this time.  Discussed this plan with patient who is in agreement.  Stable appropriate for discharge home.  Impression: Abnormal RPR  Disposition:  The patient was discharged home with instructions to follow-up with her OB/GYN within the next 2 to 4 weeks for repeat syphilis testing.  Return precautions given.   This chart was dictated using voice recognition software, Dragon. Despite the best efforts of this provider to proofread and correct errors, errors may still occur which can change documentation meaning.         Final Clinical Impression(s) / ED Diagnoses Final diagnoses:  Abnormal laboratory test    Rx / DC Orders ED Discharge Orders     None         Rexie Catena, PA-C 11/09/23 2031    Hershel Los, MD 11/09/23 2212

## 2023-11-09 NOTE — Discharge Instructions (Addendum)
 Please follow-up with your OB/GYN within the next 2 to 4 weeks for repeat syphilis test.  Please return to the ER for any unusual rash, fevers, any other new or concerning symptoms.

## 2023-11-16 ENCOUNTER — Ambulatory Visit (HOSPITAL_COMMUNITY): Payer: MEDICAID | Admitting: Clinical

## 2023-11-16 ENCOUNTER — Encounter (HOSPITAL_COMMUNITY): Payer: Self-pay

## 2023-11-18 ENCOUNTER — Encounter (HOSPITAL_COMMUNITY): Payer: Self-pay | Admitting: Obstetrics and Gynecology

## 2023-11-18 ENCOUNTER — Inpatient Hospital Stay (HOSPITAL_COMMUNITY)
Admission: AD | Admit: 2023-11-18 | Discharge: 2023-11-19 | Disposition: A | Discharge status: Home / Self Care | Payer: MEDICAID | Attending: Obstetrics and Gynecology | Admitting: Obstetrics and Gynecology

## 2023-11-18 ENCOUNTER — Other Ambulatory Visit: Payer: Self-pay

## 2023-11-18 DIAGNOSIS — O4703 False labor before 37 completed weeks of gestation, third trimester: Secondary | ICD-10-CM | POA: Insufficient documentation

## 2023-11-18 DIAGNOSIS — O26893 Other specified pregnancy related conditions, third trimester: Secondary | ICD-10-CM | POA: Diagnosis not present

## 2023-11-18 DIAGNOSIS — R1031 Right lower quadrant pain: Secondary | ICD-10-CM

## 2023-11-18 DIAGNOSIS — Z3A34 34 weeks gestation of pregnancy: Secondary | ICD-10-CM | POA: Diagnosis not present

## 2023-11-18 DIAGNOSIS — R102 Pelvic and perineal pain: Secondary | ICD-10-CM | POA: Diagnosis not present

## 2023-11-18 LAB — CBC WITH DIFFERENTIAL/PLATELET
Abs Immature Granulocytes: 0.05 10*3/uL (ref 0.00–0.07)
Basophils Absolute: 0 10*3/uL (ref 0.0–0.1)
Basophils Relative: 0 %
Eosinophils Absolute: 0 10*3/uL (ref 0.0–0.5)
Eosinophils Relative: 0 %
HCT: 34.1 % — ABNORMAL LOW (ref 36.0–46.0)
Hemoglobin: 11.7 g/dL — ABNORMAL LOW (ref 12.0–15.0)
Immature Granulocytes: 0 %
Lymphocytes Relative: 10 %
Lymphs Abs: 1.5 10*3/uL (ref 0.7–4.0)
MCH: 34 pg (ref 26.0–34.0)
MCHC: 34.3 g/dL (ref 30.0–36.0)
MCV: 99.1 fL (ref 80.0–100.0)
Monocytes Absolute: 0.9 10*3/uL (ref 0.1–1.0)
Monocytes Relative: 6 %
Neutro Abs: 12.9 10*3/uL — ABNORMAL HIGH (ref 1.7–7.7)
Neutrophils Relative %: 84 %
Platelets: 209 10*3/uL (ref 150–400)
RBC: 3.44 MIL/uL — ABNORMAL LOW (ref 3.87–5.11)
RDW: 12.7 % (ref 11.5–15.5)
WBC: 15.4 10*3/uL — ABNORMAL HIGH (ref 4.0–10.5)
nRBC: 0 % (ref 0.0–0.2)

## 2023-11-18 LAB — URINALYSIS, ROUTINE W REFLEX MICROSCOPIC
Bacteria, UA: NONE SEEN
Bilirubin Urine: NEGATIVE
Glucose, UA: NEGATIVE mg/dL
Hgb urine dipstick: NEGATIVE
Ketones, ur: 20 mg/dL — AB
Leukocytes,Ua: NEGATIVE
Nitrite: NEGATIVE
Protein, ur: NEGATIVE mg/dL
Specific Gravity, Urine: 1.011 (ref 1.005–1.030)
pH: 7 (ref 5.0–8.0)

## 2023-11-18 MED ORDER — NIFEDIPINE 10 MG PO CAPS
10.0000 mg | ORAL_CAPSULE | ORAL | Status: DC | PRN
  Administered 2023-11-18: 10 mg via ORAL

## 2023-11-18 MED ORDER — NIFEDIPINE 10 MG PO CAPS
10.0000 mg | ORAL_CAPSULE | ORAL | Status: DC | PRN
  Filled 2023-11-18: qty 1

## 2023-11-18 MED ORDER — ONDANSETRON 4 MG PO TBDP
4.0000 mg | ORAL_TABLET | Freq: Once | ORAL | Status: AC
  Administered 2023-11-18: 4 mg via ORAL
  Filled 2023-11-18: qty 1

## 2023-11-18 NOTE — MAU Note (Addendum)
 Taylor Brennan is a 23 y.o. at [redacted]w[redacted]d here in MAU reporting: here by EMS reporting sharp stabbing RLQ pain that is constant and now radiating around to her lower back. Reports N/V - emesis x2. Denies VB or LOF. +FM. Pt states she fell 3 days ago and landed on her right side. Denies taking medication for pain at home.   LMP: NA Onset of complaint: 3 hours  Pain score: 7 Vitals:   11/18/23 2159  BP: 108/62  Pulse: (!) 120  Resp: 19  Temp: 99.4 F (37.4 C)  SpO2: 98%     FHT: 152  Lab orders placed from triage: UA
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
## 2023-11-19 DIAGNOSIS — O4703 False labor before 37 completed weeks of gestation, third trimester: Secondary | ICD-10-CM

## 2023-11-19 DIAGNOSIS — Z3A34 34 weeks gestation of pregnancy: Secondary | ICD-10-CM

## 2023-11-19 DIAGNOSIS — R1031 Right lower quadrant pain: Secondary | ICD-10-CM

## 2023-11-19 DIAGNOSIS — O26893 Other specified pregnancy related conditions, third trimester: Secondary | ICD-10-CM

## 2023-11-19 LAB — COMPREHENSIVE METABOLIC PANEL WITH GFR
ALT: 15 U/L (ref 0–44)
AST: 22 U/L (ref 15–41)
Albumin: 2.8 g/dL — ABNORMAL LOW (ref 3.5–5.0)
Alkaline Phosphatase: 84 U/L (ref 38–126)
Anion gap: 10 (ref 5–15)
BUN: 6 mg/dL (ref 6–20)
CO2: 19 mmol/L — ABNORMAL LOW (ref 22–32)
Calcium: 9 mg/dL (ref 8.9–10.3)
Chloride: 104 mmol/L (ref 98–111)
Creatinine, Ser: 0.77 mg/dL (ref 0.44–1.00)
GFR, Estimated: >60 mL/min (ref >60)
Glucose, Bld: 119 mg/dL — ABNORMAL HIGH (ref 70–99)
Potassium: 3.7 mmol/L (ref 3.5–5.1)
Sodium: 133 mmol/L — ABNORMAL LOW (ref 135–145)
Total Bilirubin: 0.5 mg/dL (ref 0.0–1.2)
Total Protein: 6.4 g/dL — ABNORMAL LOW (ref 6.5–8.1)

## 2023-12-01 ENCOUNTER — Other Ambulatory Visit (HOSPITAL_COMMUNITY)
Admission: RE | Admit: 2023-12-01 | Discharge: 2023-12-01 | Disposition: A | Discharge status: Home / Self Care | Payer: MEDICAID | Source: Ambulatory Visit | Attending: Certified Nurse Midwife | Admitting: Certified Nurse Midwife

## 2023-12-01 ENCOUNTER — Encounter: Payer: Self-pay | Admitting: Certified Nurse Midwife

## 2023-12-01 ENCOUNTER — Ambulatory Visit: Payer: MEDICAID | Admitting: Certified Nurse Midwife

## 2023-12-01 VITALS — BP 116/76 | HR 101 | Wt 192.0 lb

## 2023-12-01 DIAGNOSIS — S161XXA Strain of muscle, fascia and tendon at neck level, initial encounter: Secondary | ICD-10-CM

## 2023-12-01 DIAGNOSIS — Z3480 Encounter for supervision of other normal pregnancy, unspecified trimester: Secondary | ICD-10-CM

## 2023-12-01 DIAGNOSIS — Z3A36 36 weeks gestation of pregnancy: Secondary | ICD-10-CM | POA: Insufficient documentation

## 2023-12-01 DIAGNOSIS — A530 Latent syphilis, unspecified as early or late: Secondary | ICD-10-CM | POA: Diagnosis not present

## 2023-12-01 DIAGNOSIS — Z3493 Encounter for supervision of normal pregnancy, unspecified, third trimester: Secondary | ICD-10-CM

## 2023-12-01 DIAGNOSIS — Z3483 Encounter for supervision of other normal pregnancy, third trimester: Secondary | ICD-10-CM

## 2023-12-01 NOTE — Progress Notes (Signed)
 Pt presents for ROB visit. Pt c.o increased pressure. Was seen at MAU for this on 11-18-23

## 2023-12-01 NOTE — Progress Notes (Signed)
   PRENATAL VISIT NOTE  Subjective:  Taylor Brennan is a 23 y.o. G3P2002 at [redacted]w[redacted]d being seen today for ongoing prenatal care.  She is currently monitored for the following issues for this low-risk pregnancy and has Bipolar 1 disorder, depressed, severe (HCC); Cannabis use disorder, mild, in early remission; Suicide attempt (HCC); Migraine without aura and without status migrainosus, not intractable; Supervision of other normal pregnancy, antepartum; PTSD (post-traumatic stress disorder); Obesity affecting pregnancy, antepartum; Bipolar 1 disorder, depressed (HCC); and Unspecified mood (affective) disorder (HCC) on their problem list.  Patient reports no complaints.  Contractions: Irritability. Vag. Bleeding: None.  Movement: Present. Denies leaking of fluid.   The following portions of the patient's history were reviewed and updated as appropriate: allergies, current medications, past family history, past medical history, past social history, past surgical history and problem list.   Objective:    Vitals:   12/01/23 1339  BP: 116/76  Pulse: (!) 101  Weight: 192 lb (87.1 kg)    Fetal Status:  Fetal Heart Rate (bpm): 124 Fundal Height: 36 cm Movement: Present    General: Alert, oriented and cooperative. Patient is in no acute distress.  Skin: Skin is warm and dry. No rash noted.   Cardiovascular: Normal heart rate noted  Respiratory: Normal respiratory effort, no problems with respiration noted  Abdomen: Soft, gravid, appropriate for gestational age.  Pain/Pressure: Present     Pelvic: Cervical exam deferred        Extremities: Normal range of motion.  Edema: Trace  Mental Status: Normal mood and affect. Normal behavior. Normal judgment and thought content.   Assessment and Plan:  Pregnancy: G3P2002 at [redacted]w[redacted]d 1. Encounter for supervision of low-risk pregnancy in third trimester (Primary) - Patient doing well.  - Reports vigorous and frequent fetal movement.   2. [redacted] weeks gestation  of pregnancy - Cervicovaginal ancillary only( [place]) - Culture, beta strep (group b only) - RPR  3. Positive RPR test - Recollection today, concern for false positive  - RPR  5. Strain of neck muscle, initial encounter - Recommended warm compresses and massage for relief.   Preterm labor symptoms and general obstetric precautions including but not limited to vaginal bleeding, contractions, leaking of fluid and fetal movement were reviewed in detail with the patient. Please refer to After Visit Summary for other counseling recommendations.   Return in about 1 week (around 12/08/2023) for LOB.  Future Appointments  Date Time Provider Department Center  12/07/2023  4:10 PM Tresia Fruit, MD [facility] None  12/10/2023  2:30 PM Arlyne Bering, NP [facility] None   Corretta Munce Maurie [place]) Marlys Singh, MSN, CNM  Center for [facility]  12/01/2023 5:23 PM

## 2023-12-02 ENCOUNTER — Ambulatory Visit: Payer: Self-pay | Admitting: Certified Nurse Midwife

## 2023-12-02 LAB — RPR: RPR Ser Ql: NONREACTIVE

## 2023-12-02 LAB — CERVICOVAGINAL ANCILLARY ONLY
Bacterial Vaginitis (gardnerella): POSITIVE — AB
Candida Glabrata: NEGATIVE
Candida Vaginitis: NEGATIVE
Chlamydia: NEGATIVE
Comment: NEGATIVE
Comment: NEGATIVE
Comment: NEGATIVE
Comment: NEGATIVE
Comment: NEGATIVE
Comment: NORMAL
Neisseria Gonorrhea: NEGATIVE
Trichomonas: NEGATIVE

## 2023-12-02 MED ORDER — METRONIDAZOLE 500 MG PO TABS: 500.0000 mg | ORAL_TABLET | Freq: Two times a day (BID) | ORAL | 0 refills | Status: DC

## 2023-12-05 LAB — CULTURE, BETA STREP (GROUP B ONLY): Strep Gp B Culture: POSITIVE — AB

## 2023-12-07 ENCOUNTER — Encounter: Payer: MEDICAID | Admitting: Obstetrics & Gynecology

## 2023-12-09 ENCOUNTER — Ambulatory Visit: Payer: Self-pay | Admitting: Obstetrics and Gynecology

## 2023-12-09 ENCOUNTER — Inpatient Hospital Stay (HOSPITAL_COMMUNITY)
Admission: AD | Admit: 2023-12-09 | Discharge: 2023-12-09 | Disposition: A | Discharge status: Home / Self Care | Payer: MEDICAID | Attending: Obstetrics and Gynecology | Admitting: Obstetrics and Gynecology

## 2023-12-09 ENCOUNTER — Encounter (HOSPITAL_COMMUNITY): Payer: Self-pay | Admitting: Obstetrics and Gynecology

## 2023-12-09 DIAGNOSIS — O471 False labor at or after 37 completed weeks of gestation: Secondary | ICD-10-CM | POA: Diagnosis present

## 2023-12-09 DIAGNOSIS — Z3A37 37 weeks gestation of pregnancy: Secondary | ICD-10-CM | POA: Diagnosis not present

## 2023-12-09 DIAGNOSIS — Z3689 Encounter for other specified antenatal screening: Secondary | ICD-10-CM

## 2023-12-09 DIAGNOSIS — O479 False labor, unspecified: Secondary | ICD-10-CM

## 2023-12-09 LAB — URINALYSIS, ROUTINE W REFLEX MICROSCOPIC
Bilirubin Urine: NEGATIVE
Glucose, UA: NEGATIVE mg/dL
Hgb urine dipstick: NEGATIVE
Ketones, ur: >80 mg/dL — AB
Leukocytes,Ua: NEGATIVE
Nitrite: NEGATIVE
Protein, ur: NEGATIVE mg/dL
Specific Gravity, Urine: 1.025 (ref 1.005–1.030)
pH: 6.5 (ref 5.0–8.0)

## 2023-12-09 NOTE — MAU Note (Signed)
 Taylor Brennan is a 23 y.o. at [redacted]w[redacted]d here in MAU reporting:   Pt states she is having abdominal pain and vaginal pressure, unsure if she is having ctxs. But the vaginal and abdominal pain is 8/10.  No LOF, no bleeding. +FM.  White mucous discharge.  Last Cervical exam 1.5 per pt. GBS pos.   Vitals:   12/09/23 1150  BP: 116/69  Pulse: (!) 108  Resp: 17  Temp: 98.3 F (36.8 C)  SpO2: 95%       FHT: 149 Lab orders placed from triage: UA.

## 2023-12-09 NOTE — MAU Provider Note (Signed)
 S: Ms. Najat Olazabal is a 23 y.o. G3P2002 at [redacted]w[redacted]d  who presents to MAU today for labor evaluation.     Cervical exam by RN:  Dilation: 1 Effacement (%): Thick Cervical Position: Posterior Station: -3 Exam by:: Dorian Gardener RN  Fetal Monitoring: Baseline: 130 Variability: moderate Accelerations: present Decelerations: absent Contractions: irregular  MDM Discussed patient with RN. NST reviewed. Did have questionable maternal tracing 1202. Monitored for additional 20 minutes with further Cat I tracing.  A: SIUP at [redacted]w[redacted]d  False labor  P: Discharge home Labor precautions and kick counts included in AVS Patient to follow-up with primary OB as scheduled  Patient may return to MAU as needed or when in labor   Maud Sorenson, MD 12/09/2023 12:24 PM

## 2023-12-10 ENCOUNTER — Encounter (HOSPITAL_COMMUNITY): Payer: Self-pay | Admitting: Psychiatry

## 2023-12-10 ENCOUNTER — Telehealth (HOSPITAL_COMMUNITY): Payer: MEDICAID | Admitting: Psychiatry

## 2023-12-10 DIAGNOSIS — F39 Unspecified mood [affective] disorder: Secondary | ICD-10-CM | POA: Diagnosis not present

## 2023-12-10 MED ORDER — LAMOTRIGINE 100 MG PO TABS: 100.0000 mg | ORAL_TABLET | Freq: Every day | ORAL | 3 refills | Status: AC

## 2023-12-10 MED ORDER — ARIPIPRAZOLE 2 MG PO TABS: 2.0000 mg | ORAL_TABLET | Freq: Every day | ORAL | 3 refills | Status: DC
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
## 2023-12-14 ENCOUNTER — Ambulatory Visit (INDEPENDENT_AMBULATORY_CARE_PROVIDER_SITE_OTHER): Payer: MEDICAID | Admitting: Obstetrics

## 2023-12-14 VITALS — BP 126/81 | HR 105 | Wt 194.2 lb

## 2023-12-14 DIAGNOSIS — Z3493 Encounter for supervision of normal pregnancy, unspecified, third trimester: Secondary | ICD-10-CM | POA: Diagnosis not present

## 2023-12-14 DIAGNOSIS — B951 Streptococcus, group B, as the cause of diseases classified elsewhere: Secondary | ICD-10-CM | POA: Diagnosis not present

## 2023-12-14 NOTE — Progress Notes (Unsigned)
 Subjective:  Taylor Brennan is a 23 y.o. G3P2002 at [redacted]w[redacted]d being seen today for ongoing prenatal care.  She is currently monitored for the following issues for this low-risk pregnancy and has Bipolar 1 disorder, depressed, severe (HCC); Cannabis use disorder, mild, in early remission; Suicide attempt (HCC); Migraine without aura and without status migrainosus, not intractable; Supervision of other normal pregnancy, antepartum; PTSD (post-traumatic stress disorder); Obesity affecting pregnancy, antepartum; Bipolar 1 disorder, depressed (HCC); and Unspecified mood (affective) disorder (HCC) on their problem list.  Patient reports occasional contractions and pelvic pressure.  Contractions: Irritability. Vag. Bleeding: None.  Movement: Present. Denies leaking of fluid.   The following portions of the patient's history were reviewed and updated as appropriate: allergies, current medications, past family history, past medical history, past social history, past surgical history and problem list. Problem list updated.  Objective:   Vitals:   12/14/23 1537  BP: 126/81  Pulse: (!) 105  Weight: 194 lb 3.2 oz (88.1 kg)    Fetal Status: Fetal Heart Rate (bpm): 132   Movement: Present     General:  Alert, oriented and cooperative. Patient is in no acute distress.  Skin: Skin is warm and dry. No rash noted.   Cardiovascular: Normal heart rate noted  Respiratory: Normal respiratory effort, no problems with respiration noted  Abdomen: Soft, gravid, appropriate for gestational age. Pain/Pressure: Present     Pelvic:  Cervical exam deferred        Extremities: Normal range of motion.  Edema: Trace  Mental Status: Normal mood and affect. Normal behavior. Normal judgment and thought content.   Urinalysis:      Assessment and Plan:  Pregnancy: G3P2002 at [redacted]w[redacted]d  1. Encounter for supervision of low-risk pregnancy in third trimester (Primary)  2. Positive GBS test - treat in labor    There are no  diagnoses linked to this encounter. Term labor symptoms and general obstetric precautions including but not limited to vaginal bleeding, contractions, leaking of fluid and fetal movement were reviewed in detail with the patient. Please refer to After Visit Summary for other counseling recommendations.   Return in about 1 week (around 12/21/2023) for ROB.   Gabrielle Joiner, MD 12/14/2023

## 2023-12-14 NOTE — Progress Notes (Unsigned)
 Pt presents for rob. Pt has no questions or concerns at this time.

## 2023-12-18 ENCOUNTER — Telehealth: Payer: MEDICAID | Admitting: Emergency Medicine

## 2023-12-18 DIAGNOSIS — N898 Other specified noninflammatory disorders of vagina: Secondary | ICD-10-CM

## 2023-12-18 NOTE — Patient Instructions (Signed)
 Please call your OBGYN or go to the Maternity Admissions Unit at [facility] for further evaluation.   Taylor Brennan, thank you for joining Taylor Dikes, PA-C for today's virtual visit.  While this provider is not your primary care provider (PCP), if your PCP is located in our provider database this encounter information will be shared with them immediately following your visit.   A Taylor Brennan MyChart account gives you access to today's visit and all your visits, tests, and labs performed at [facility]  click here if you don't have a [place] MyChart account or go to [email or web address]  Consent: (Patient) Taylor Brennan provided verbal consent for this virtual visit at the beginning of the encounter.  Current Medications:  Current Outpatient Medications:    ARIPiprazole  (ABILIFY ) 2 MG tablet, Take 1 tablet (2 mg total) by mouth daily. (Patient not taking: Reported on 12/14/2023), Disp: 30 tablet, Rfl: 3   lamoTRIgine  (LAMICTAL ) 100 MG tablet, Take 1 tablet (100 mg total) by mouth daily., Disp: 30 tablet, Rfl: 3   metroNIDAZOLE  (FLAGYL ) 500 MG tablet, Take 1 tablet (500 mg total) by mouth 2 (two) times daily. (Patient not taking: Reported on 12/14/2023), Disp: 14 tablet, Rfl: 0   Prenatal Vit-Fe Fumarate-FA (PRENATAL VITAMIN) 27-0.8 MG TABS, Take 1 tablet by mouth daily., Disp: 30 tablet, Rfl: 12   Medications ordered in this encounter:  No orders of the defined types were placed in this encounter.    *If you need refills on other medications prior to your next appointment, please contact your pharmacy*  Follow-Up: Call back or seek an in-person evaluation if the symptoms worsen or if the condition fails to improve as anticipated.  [facility] [phone]  Other Instructions    If you have been instructed to have an in-person evaluation today at a local Urgent Care facility, please use the link below. It will take you to a list of all of our  available [facility], including address, phone number and hours of operation. Please do not delay care.  [facility]  If you or a family member do not have a primary care provider, use the link below to schedule a visit and establish care. When you choose a [place] primary care physician or advanced practice provider, you gain a long-term partner in health. Find a Primary Care Provider  Learn more about [place] [facility]'s in-office and virtual care options:  - Get Care Now

## 2023-12-18 NOTE — Progress Notes (Signed)
 Virtual Visit Consent   [facility], you are scheduled for a virtual visit with a [place] provider today. Just as with appointments in the office, your consent must be obtained to participate. Your consent will be active for this visit and any virtual visit you may have with one of our providers in the next 365 days. If you have a MyChart account, a copy of this consent can be sent to you electronically.  As this is a virtual visit, video technology does not allow for your provider to perform a traditional examination. This may limit your provider's ability to fully assess your condition. If your provider identifies any concerns that need to be evaluated in person or the need to arrange testing (such as labs, EKG, etc.), we will make arrangements to do so. Although advances in technology are sophisticated, we cannot ensure that it will always work on either your end or our end. If the connection with a video visit is poor, the visit may have to be switched to a telephone visit. With either a video or telephone visit, we are not always able to ensure that we have a secure connection.  By engaging in this virtual visit, you consent to the provision of healthcare and authorize for your insurance to be billed (if applicable) for the services provided during this visit. Depending on your insurance coverage, you may receive a charge related to this service.  I need to obtain your verbal consent now. Are you willing to proceed with your visit today? [name] has provided verbal consent on 12/18/2023 for a virtual visit (video or telephone). [name], PA-C  Date: 12/18/2023 11:19 AM   Virtual Visit via Video Note   I, [name], connected with  Chestine Costain  ([id number], 2000/08/19) on 12/18/23 at 11:15 AM EDT by a video-enabled telemedicine application and verified that I am speaking with the correct person using two identifiers.  Location: Patient: Virtual Visit Location  Patient: Home Provider: Virtual Visit Location Provider: Home Office   I discussed the limitations of evaluation and management by telemedicine and the availability of in person appointments. The patient expressed understanding and agreed to proceed.    History of Present Illness: Taylor Brennan is a 23 y.o. who identifies as a female who was assigned female at birth, and is being seen today for vaginal discharge and itching. States that she is pregnant.  States that she is still having vaginal discharge.  She states that she is about 38 weeks.  States that she was just treated about 2 weeks ago for BV, but continues to have symptoms.Aaron Aas  HPI: HPI  Problems:  Patient Active Problem List   Diagnosis Date Noted   Unspecified mood (affective) disorder (HCC) 09/30/2023   Bipolar 1 disorder, depressed (HCC) 09/29/2023   Obesity affecting pregnancy, antepartum 07/27/2023   PTSD (post-traumatic stress disorder) 07/21/2023   Supervision of other normal pregnancy, antepartum 06/09/2023   Migraine without aura and without status migrainosus, not intractable 06/01/2023   Bipolar 1 disorder, depressed, severe (HCC) 06/08/2019   Cannabis use disorder, mild, in early remission 06/08/2019   Suicide attempt (HCC) 06/08/2019    Allergies: No Known Allergies Medications:  Current Outpatient Medications:    ARIPiprazole  (ABILIFY ) 2 MG tablet, Take 1 tablet (2 mg total) by mouth daily. (Patient not taking: Reported on 12/14/2023), Disp: 30 tablet, Rfl: 3   lamoTRIgine  (LAMICTAL ) 100 MG tablet, Take 1 tablet (100 mg total) by mouth daily., Disp: 30 tablet, Rfl: 3  metroNIDAZOLE  (FLAGYL ) 500 MG tablet, Take 1 tablet (500 mg total) by mouth 2 (two) times daily. (Patient not taking: Reported on 12/14/2023), Disp: 14 tablet, Rfl: 0   Prenatal Vit-Fe Fumarate-FA (PRENATAL VITAMIN) 27-0.8 MG TABS, Take 1 tablet by mouth daily., Disp: 30 tablet, Rfl: 12  Observations/Objective: Patient is well-developed,  well-nourished in no acute distress.  Resting comfortably at home.  Head is normocephalic, atraumatic.  No labored breathing.  Speech is clear and coherent with logical content.  Patient is alert and oriented at baseline.    Assessment and Plan: 1. Vaginal discharge during pregnancy in third trimester (Primary)   Vaginal discharge at 38 weeks, this is outside of the scope for a virtual visit.  Will need to be seen by OB.  I've advised patient to call today.  Follow Up Instructions: I discussed the assessment and treatment plan with the patient. The patient was provided an opportunity to ask questions and all were answered. The patient agreed with the plan and demonstrated an understanding of the instructions.  A copy of instructions were sent to the patient via MyChart unless otherwise noted below.     The patient was advised to call back or seek an in-person evaluation if the symptoms worsen or if the condition fails to improve as anticipated.    [name], PA-C

## 2023-12-21 ENCOUNTER — Other Ambulatory Visit: Payer: Self-pay

## 2023-12-21 ENCOUNTER — Ambulatory Visit: Payer: MEDICAID | Admitting: Obstetrics

## 2023-12-21 ENCOUNTER — Other Ambulatory Visit (HOSPITAL_COMMUNITY)
Admission: RE | Admit: 2023-12-21 | Discharge: 2023-12-21 | Disposition: A | Discharge status: Home / Self Care | Payer: MEDICAID | Source: Ambulatory Visit | Attending: Obstetrics and Gynecology | Admitting: Obstetrics and Gynecology

## 2023-12-21 ENCOUNTER — Ambulatory Visit (INDEPENDENT_AMBULATORY_CARE_PROVIDER_SITE_OTHER): Payer: MEDICAID | Admitting: Obstetrics and Gynecology

## 2023-12-21 VITALS — BP 120/77 | HR 98 | Wt 195.0 lb

## 2023-12-21 VITALS — BP 120/77 | HR 98 | Wt 195.2 lb

## 2023-12-21 DIAGNOSIS — Z3480 Encounter for supervision of other normal pregnancy, unspecified trimester: Secondary | ICD-10-CM

## 2023-12-21 DIAGNOSIS — F319 Bipolar disorder, unspecified: Secondary | ICD-10-CM | POA: Diagnosis not present

## 2023-12-21 DIAGNOSIS — Z3A39 39 weeks gestation of pregnancy: Secondary | ICD-10-CM | POA: Diagnosis not present

## 2023-12-21 DIAGNOSIS — F4310 Post-traumatic stress disorder, unspecified: Secondary | ICD-10-CM

## 2023-12-21 MED ORDER — METRONIDAZOLE 500 MG PO TABS: 500.0000 mg | ORAL_TABLET | Freq: Two times a day (BID) | ORAL | 0 refills | Status: DC

## 2023-12-21 MED ORDER — TERCONAZOLE 0.8 % VA CREA: 1.0000 | TOPICAL_CREAM | Freq: Every day | VAGINAL | 0 refills | Status: DC

## 2023-12-21 NOTE — Progress Notes (Signed)
   PRENATAL VISIT NOTE  Subjective:  Taylor Brennan is a 23 y.o. G3P2002 at [redacted]w[redacted]d being seen today for ongoing prenatal care.  She is currently monitored for the following issues for this low-risk pregnancy and has Bipolar 1 disorder, depressed, severe (HCC); Cannabis use disorder, mild, in early remission; Suicide attempt (HCC); Migraine without aura and without status migrainosus, not intractable; Supervision of other normal pregnancy, antepartum; PTSD (post-traumatic stress disorder); Obesity affecting pregnancy, antepartum; Bipolar 1 disorder, depressed (HCC); and Unspecified mood (affective) disorder (HCC) on their problem list.  Patient reports no complaints.  Contractions: Not present.  .  Movement: Present. Denies leaking of fluid.   The following portions of the patient's history were reviewed and updated as appropriate: allergies, current medications, past family history, past medical history, past social history, past surgical history and problem list.   Objective:   Vitals:   12/21/23 1518  BP: 120/77  Pulse: 98  Weight: 195 lb (88.5 kg)   Fetal Status: Fetal Heart Rate (bpm): 124 Fundal Height: 39 cm Movement: Present  Presentation: Vertex  General:  Alert, oriented and cooperative. Patient is in no acute distress.  Skin: Skin is warm and dry. No rash noted.   Cardiovascular: Normal heart rate noted  Respiratory: Normal respiratory effort, no problems with respiration noted  Abdomen: Soft, gravid, appropriate for gestational age.  Pain/Pressure: Present      Assessment and Plan:  Pregnancy: G3P2002 at [redacted]w[redacted]d 1. Supervision of other normal pregnancy, antepartum (Primary) 2. [redacted] weeks gestation of pregnancy Membrane sweep offered, pt accepts, performed in presence of chaperone without complication. Advised to expect cramping/spotting, reviewed MAU precautions  Vertex by US  today Discussed & ordered pdIOL   3. Bipolar 1 disorder, depressed (HCC) 4. PTSD (post-traumatic  stress disorder) Continue lamictal , no mood complaints today  Term labor symptoms and general obstetric precautions including but not limited to vaginal bleeding, contractions, leaking of fluid and fetal movement were reviewed in detail with the patient. Please refer to After Visit Summary for other counseling recommendations.   Return in about 1 week (around 12/28/2023) for ROB at 40 weeks with NST - female only provider.  Future Appointments  Date Time Provider Department Center  12/29/2023  2:50 PM Taylor Brennan, CNM [facility] None  01/28/2024  9:00 AM Brennan, Taylor Barba, LCSW [facility] None  02/26/2024 11:30 AM Brennan, Taylor Ket [facility] None   Taylor Marsh, MD

## 2023-12-21 NOTE — Progress Notes (Signed)
 Pt presents for ROB. Would like swab to r/o yeast infection. Pt experiencing discharge ~5 days.

## 2023-12-21 NOTE — Addendum Note (Signed)
 Addended by: Marita Sidle on: 12/21/2023 05:08 PM   Modules accepted: Orders

## 2023-12-21 NOTE — Progress Notes (Deleted)
   PRENATAL VISIT NOTE  Subjective:  Taylor Brennan is a 23 y.o. G3P2002 at [redacted]w[redacted]d being seen today for ongoing prenatal care.  She is currently monitored for the following issues for this {Blank single:19197::high-risk,low-risk} pregnancy and has Bipolar 1 disorder, depressed, severe (HCC); Cannabis use disorder, mild, in early remission; Suicide attempt (HCC); Migraine without aura and without status migrainosus, not intractable; Supervision of other normal pregnancy, antepartum; PTSD (post-traumatic stress disorder); Obesity affecting pregnancy, antepartum; Bipolar 1 disorder, depressed (HCC); and Unspecified mood (affective) disorder (HCC) on their problem list.  Patient reports {sx:14538}.   .  .   . Denies leaking of fluid.   The following portions of the patient's history were reviewed and updated as appropriate: allergies, current medications, past family history, past medical history, past social history, past surgical history and problem list.   Objective:  There were no vitals filed for this visit.  Fetal Status:           General:  Alert, oriented and cooperative. Patient is in no acute distress.  Skin: Skin is warm and dry. No rash noted.   Cardiovascular: Normal heart rate noted  Respiratory: Normal respiratory effort, no problems with respiration noted  Abdomen: Soft, gravid, appropriate for gestational age.         Assessment and Plan:  Pregnancy: G3P2002 at [redacted]w[redacted]d 1. Supervision of other normal pregnancy, antepartum (Primary) 2. [redacted] weeks gestation of pregnancy ***  3. Bipolar 1 disorder, depressed (HCC) ***  4. PTSD (post-traumatic stress disorder) ***   {Routine Plan:28585}  {Blank single:19197::Term,Preterm} labor symptoms and general obstetric precautions including but not limited to vaginal bleeding, contractions, leaking of fluid and fetal movement were reviewed in detail with the patient. Please refer to After Visit Summary for other counseling  recommendations.   Return in about 1 week (around 12/28/2023) for ROB at 40 weeks with NST - female only provider.  Future Appointments  Date Time Provider Department Center  01/28/2024  9:00 AM Cozart, Angel Barba, [place] [facility] None  02/26/2024 11:30 AM Bahraini, Genevive Ket [facility] None    Izell Marsh, MD

## 2023-12-23 ENCOUNTER — Ambulatory Visit: Payer: Self-pay | Admitting: Obstetrics and Gynecology

## 2023-12-23 ENCOUNTER — Encounter: Payer: Self-pay | Admitting: Advanced Practice Midwife

## 2023-12-23 LAB — CERVICOVAGINAL ANCILLARY ONLY
Bacterial Vaginitis (gardnerella): NEGATIVE
Candida Glabrata: NEGATIVE
Candida Vaginitis: NEGATIVE
Chlamydia: NEGATIVE
Comment: NEGATIVE
Comment: NEGATIVE
Comment: NEGATIVE
Comment: NEGATIVE
Comment: NEGATIVE
Comment: NORMAL
Neisseria Gonorrhea: NEGATIVE
Trichomonas: NEGATIVE

## 2023-12-24 ENCOUNTER — Encounter (HOSPITAL_COMMUNITY): Payer: Self-pay | Admitting: Obstetrics & Gynecology

## 2023-12-24 ENCOUNTER — Inpatient Hospital Stay (HOSPITAL_COMMUNITY)
Admission: AD | Admit: 2023-12-24 | Discharge: 2023-12-24 | Disposition: A | Discharge status: Home / Self Care | Payer: MEDICAID | Attending: Obstetrics & Gynecology | Admitting: Obstetrics & Gynecology

## 2023-12-24 DIAGNOSIS — Z3A39 39 weeks gestation of pregnancy: Secondary | ICD-10-CM | POA: Diagnosis not present

## 2023-12-24 DIAGNOSIS — O26891 Other specified pregnancy related conditions, first trimester: Secondary | ICD-10-CM | POA: Diagnosis not present

## 2023-12-24 DIAGNOSIS — N898 Other specified noninflammatory disorders of vagina: Secondary | ICD-10-CM | POA: Diagnosis not present

## 2023-12-24 DIAGNOSIS — O26893 Other specified pregnancy related conditions, third trimester: Secondary | ICD-10-CM | POA: Insufficient documentation

## 2023-12-24 DIAGNOSIS — Z0371 Encounter for suspected problem with amniotic cavity and membrane ruled out: Secondary | ICD-10-CM | POA: Diagnosis present
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
## 2023-12-24 NOTE — Progress Notes (Signed)
 Patient rescheduled.

## 2023-12-24 NOTE — MAU Note (Signed)
 Taylor Brennan is a 23 y.o. at [redacted]w[redacted]d here in MAU reporting:   Pt states she lost her mucous plug. It was thick,brown, some dark blood and it looked like discharge.  +FM.  Was 1.5 cm in the office.   Pain score: Abdominal cramping. 5/10   Lab orders placed from triage:UA.   Vitals:   12/24/23 0022  BP: 116/71  Pulse: (!) 103

## 2023-12-28 ENCOUNTER — Inpatient Hospital Stay (HOSPITAL_COMMUNITY)
Admission: AD | Admit: 2023-12-28 | Discharge: 2023-12-31 | DRG: 806 | Disposition: A | Discharge status: Home / Self Care | Payer: MEDICAID | Attending: Family Medicine | Admitting: Family Medicine

## 2023-12-28 ENCOUNTER — Other Ambulatory Visit: Payer: Self-pay

## 2023-12-28 ENCOUNTER — Encounter (HOSPITAL_COMMUNITY): Payer: Self-pay | Admitting: Obstetrics and Gynecology

## 2023-12-28 DIAGNOSIS — F314 Bipolar disorder, current episode depressed, severe, without psychotic features: Secondary | ICD-10-CM | POA: Diagnosis present

## 2023-12-28 DIAGNOSIS — O99824 Streptococcus B carrier state complicating childbirth: Secondary | ICD-10-CM | POA: Diagnosis present

## 2023-12-28 DIAGNOSIS — Z3A40 40 weeks gestation of pregnancy: Secondary | ICD-10-CM | POA: Diagnosis not present

## 2023-12-28 DIAGNOSIS — F4310 Post-traumatic stress disorder, unspecified: Secondary | ICD-10-CM | POA: Diagnosis present

## 2023-12-28 DIAGNOSIS — O99214 Obesity complicating childbirth: Secondary | ICD-10-CM | POA: Diagnosis present

## 2023-12-28 DIAGNOSIS — Z79899 Other long term (current) drug therapy: Secondary | ICD-10-CM | POA: Diagnosis not present

## 2023-12-28 DIAGNOSIS — Z3490 Encounter for supervision of normal pregnancy, unspecified, unspecified trimester: Secondary | ICD-10-CM | POA: Diagnosis present

## 2023-12-28 DIAGNOSIS — T413X5A Adverse effect of local anesthetics, initial encounter: Secondary | ICD-10-CM | POA: Diagnosis not present

## 2023-12-28 DIAGNOSIS — Z87891 Personal history of nicotine dependence: Secondary | ICD-10-CM | POA: Diagnosis not present

## 2023-12-28 DIAGNOSIS — Z555 Less than a high school diploma: Secondary | ICD-10-CM | POA: Diagnosis not present

## 2023-12-28 DIAGNOSIS — O9A22 Injury, poisoning and certain other consequences of external causes complicating childbirth: Secondary | ICD-10-CM | POA: Diagnosis not present

## 2023-12-28 DIAGNOSIS — O99210 Obesity complicating pregnancy, unspecified trimester: Secondary | ICD-10-CM | POA: Diagnosis present

## 2023-12-28 DIAGNOSIS — O99324 Drug use complicating childbirth: Secondary | ICD-10-CM | POA: Diagnosis not present

## 2023-12-28 DIAGNOSIS — F1211 Cannabis abuse, in remission: Secondary | ICD-10-CM | POA: Diagnosis present

## 2023-12-28 DIAGNOSIS — O99344 Other mental disorders complicating childbirth: Secondary | ICD-10-CM | POA: Diagnosis present

## 2023-12-28 DIAGNOSIS — O480 Post-term pregnancy: Secondary | ICD-10-CM | POA: Diagnosis not present

## 2023-12-28 DIAGNOSIS — I952 Hypotension due to drugs: Secondary | ICD-10-CM | POA: Diagnosis not present

## 2023-12-28 DIAGNOSIS — Z3480 Encounter for supervision of other normal pregnancy, unspecified trimester: Principal | ICD-10-CM

## 2023-12-28 DIAGNOSIS — T1491XA Suicide attempt, initial encounter: Secondary | ICD-10-CM | POA: Diagnosis present

## 2023-12-28 DIAGNOSIS — O99820 Streptococcus B carrier state complicating pregnancy: Secondary | ICD-10-CM | POA: Diagnosis not present

## 2023-12-28 DIAGNOSIS — O26893 Other specified pregnancy related conditions, third trimester: Secondary | ICD-10-CM | POA: Diagnosis present

## 2023-12-28 LAB — CBC
HCT: 36.9 % (ref 36.0–46.0)
Hemoglobin: 12.4 g/dL (ref 12.0–15.0)
MCH: 33.1 pg (ref 26.0–34.0)
MCHC: 33.6 g/dL (ref 30.0–36.0)
MCV: 98.4 fL (ref 80.0–100.0)
Platelets: 280 10*3/uL (ref 150–400)
RBC: 3.75 MIL/uL — ABNORMAL LOW (ref 3.87–5.11)
RDW: 13.3 % (ref 11.5–15.5)
WBC: 9.5 10*3/uL (ref 4.0–10.5)
nRBC: 0 % (ref 0.0–0.2)

## 2023-12-28 LAB — TYPE AND SCREEN
ABO/RH(D): O POS
Antibody Screen: NEGATIVE

## 2023-12-28 MED ORDER — SODIUM CHLORIDE 0.9 % IV SOLN
5.0000 10*6.[IU] | Freq: Once | INTRAVENOUS | Status: AC
  Administered 2023-12-28: 5 10*6.[IU] via INTRAVENOUS
  Filled 2023-12-28: qty 5

## 2023-12-28 MED ORDER — PHENYLEPHRINE 80 MCG/ML (10ML) SYRINGE FOR IV PUSH (FOR BLOOD PRESSURE SUPPORT)
80.0000 ug | PREFILLED_SYRINGE | INTRAVENOUS | Status: DC | PRN
  Administered 2023-12-29: 80 ug via INTRAVENOUS

## 2023-12-28 MED ORDER — EPHEDRINE 5 MG/ML INJ
10.0000 mg | INTRAVENOUS | Status: DC | PRN
  Administered 2023-12-29: 10 mg via INTRAVENOUS
  Filled 2023-12-28: qty 5

## 2023-12-28 MED ORDER — ONDANSETRON HCL 4 MG/2ML IJ SOLN
4.0000 mg | Freq: Four times a day (QID) | INTRAMUSCULAR | Status: DC | PRN
  Administered 2023-12-29: 4 mg via INTRAVENOUS
  Filled 2023-12-28: qty 2

## 2023-12-28 MED ORDER — LIDOCAINE HCL (PF) 1 % IJ SOLN: 30.0000 mL | INTRAMUSCULAR | Status: DC | PRN

## 2023-12-28 MED ORDER — EPHEDRINE 5 MG/ML INJ
10.0000 mg | INTRAVENOUS | Status: DC | PRN
Start: 2023-12-28 — End: 2023-12-29

## 2023-12-28 MED ORDER — ACETAMINOPHEN 325 MG PO TABS: 650.0000 mg | ORAL_TABLET | ORAL | Status: DC | PRN

## 2023-12-28 MED ORDER — LAMOTRIGINE 100 MG PO TABS
100.0000 mg | ORAL_TABLET | Freq: Every day | ORAL | Status: DC
  Administered 2023-12-28 – 2023-12-30 (×3): 100 mg via ORAL
  Filled 2023-12-28 (×4): qty 1

## 2023-12-28 MED ORDER — DIPHENHYDRAMINE HCL 50 MG/ML IJ SOLN
12.5000 mg | INTRAMUSCULAR | Status: DC | PRN
  Administered 2023-12-29: 12.5 mg via INTRAVENOUS
  Filled 2023-12-28: qty 1

## 2023-12-28 MED ORDER — LACTATED RINGERS IV SOLN: INTRAVENOUS | Status: AC

## 2023-12-28 MED ORDER — LACTATED RINGERS IV SOLN
500.0000 mL | INTRAVENOUS | Status: DC | PRN
  Administered 2023-12-29: 500 mL via INTRAVENOUS

## 2023-12-28 MED ORDER — PHENYLEPHRINE 80 MCG/ML (10ML) SYRINGE FOR IV PUSH (FOR BLOOD PRESSURE SUPPORT)
80.0000 ug | PREFILLED_SYRINGE | INTRAVENOUS | Status: DC | PRN
Start: 2023-12-28 — End: 2023-12-29
  Administered 2023-12-29: 80 ug via INTRAVENOUS
  Filled 2023-12-28: qty 10

## 2023-12-28 MED ORDER — FENTANYL CITRATE (PF) 100 MCG/2ML IJ SOLN: 50.0000 ug | INTRAMUSCULAR | Status: DC | PRN

## 2023-12-28 MED ORDER — SODIUM CHLORIDE 0.9% FLUSH: 3.0000 mL | INTRAVENOUS | Status: DC | PRN

## 2023-12-28 MED ORDER — TERBUTALINE SULFATE 1 MG/ML IJ SOLN
0.2500 mg | Freq: Once | INTRAMUSCULAR | Status: AC | PRN
  Administered 2023-12-29: 0.25 mg via SUBCUTANEOUS
  Filled 2023-12-28: qty 1

## 2023-12-28 MED ORDER — OXYTOCIN-SODIUM CHLORIDE 30-0.9 UT/500ML-% IV SOLN: 2.5000 [IU]/h | INTRAVENOUS | Status: DC

## 2023-12-28 MED ORDER — FENTANYL-BUPIVACAINE-NACL 0.5-0.125-0.9 MG/250ML-% EP SOLN
12.0000 mL/h | EPIDURAL | Status: DC | PRN
  Administered 2023-12-29: 12 mL/h via EPIDURAL
  Filled 2023-12-28: qty 250

## 2023-12-28 MED ORDER — OXYTOCIN-SODIUM CHLORIDE 30-0.9 UT/500ML-% IV SOLN
1.0000 m[IU]/min | INTRAVENOUS | Status: DC
  Filled 2023-12-28: qty 500

## 2023-12-28 MED ORDER — SOD CITRATE-CITRIC ACID 500-334 MG/5ML PO SOLN: 30.0000 mL | ORAL | Status: DC | PRN

## 2023-12-28 MED ORDER — OXYTOCIN BOLUS FROM INFUSION
333.0000 mL | Freq: Once | INTRAVENOUS | Status: AC
  Administered 2023-12-29: 333 mL via INTRAVENOUS

## 2023-12-28 MED ORDER — SODIUM CHLORIDE 0.9% FLUSH: 3.0000 mL | Freq: Two times a day (BID) | INTRAVENOUS | Status: DC

## 2023-12-28 MED ORDER — SODIUM CHLORIDE 0.9 % IV SOLN: 250.0000 mL | INTRAVENOUS | Status: DC | PRN

## 2023-12-28 MED ORDER — PENICILLIN G POT IN DEXTROSE 60000 UNIT/ML IV SOLN
3.0000 10*6.[IU] | INTRAVENOUS | Status: DC
  Administered 2023-12-29 (×3): 3 10*6.[IU] via INTRAVENOUS
  Filled 2023-12-28 (×3): qty 50

## 2023-12-28 MED ORDER — LACTATED RINGERS IV SOLN
500.0000 mL | Freq: Once | INTRAVENOUS | Status: AC
  Administered 2023-12-29: 500 mL via INTRAVENOUS

## 2023-12-28 NOTE — MAU Note (Signed)
 Pt says she feels UC's - strong since 730. Pain -8/10 PNC- [facility] VE- 1-2 cm on 6-16 Denies HSV GBS- positive
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
## 2023-12-29 ENCOUNTER — Encounter: Payer: MEDICAID | Admitting: Advanced Practice Midwife

## 2023-12-29 ENCOUNTER — Encounter (HOSPITAL_COMMUNITY): Payer: Self-pay | Admitting: Obstetrics and Gynecology

## 2023-12-29 ENCOUNTER — Inpatient Hospital Stay (HOSPITAL_COMMUNITY): Payer: MEDICAID | Admitting: Anesthesiology

## 2023-12-29 DIAGNOSIS — O99214 Obesity complicating childbirth: Secondary | ICD-10-CM

## 2023-12-29 DIAGNOSIS — O99344 Other mental disorders complicating childbirth: Secondary | ICD-10-CM

## 2023-12-29 DIAGNOSIS — Z3A40 40 weeks gestation of pregnancy: Secondary | ICD-10-CM

## 2023-12-29 DIAGNOSIS — O99324 Drug use complicating childbirth: Secondary | ICD-10-CM

## 2023-12-29 DIAGNOSIS — O480 Post-term pregnancy: Secondary | ICD-10-CM

## 2023-12-29 DIAGNOSIS — O99820 Streptococcus B carrier state complicating pregnancy: Secondary | ICD-10-CM

## 2023-12-29 LAB — RPR: RPR Ser Ql: NONREACTIVE

## 2023-12-29 MED ORDER — IBUPROFEN 800 MG PO TABS
800.0000 mg | ORAL_TABLET | Freq: Three times a day (TID) | ORAL | Status: DC
  Administered 2023-12-29 – 2023-12-31 (×6): 800 mg via ORAL
  Filled 2023-12-29 (×6): qty 1

## 2023-12-29 MED ORDER — ACETAMINOPHEN 500 MG PO TABS
1000.0000 mg | ORAL_TABLET | Freq: Three times a day (TID) | ORAL | Status: DC
  Administered 2023-12-29 – 2023-12-31 (×6): 1000 mg via ORAL
  Filled 2023-12-29 (×7): qty 2

## 2023-12-29 MED ORDER — LIDOCAINE HCL (PF) 1 % IJ SOLN
INTRAMUSCULAR | Status: DC | PRN
  Administered 2023-12-29: 11 mL via EPIDURAL

## 2023-12-29 MED ORDER — ONDANSETRON HCL 4 MG PO TABS: 4.0000 mg | ORAL_TABLET | ORAL | Status: DC | PRN

## 2023-12-29 MED ORDER — DIPHENHYDRAMINE HCL 25 MG PO CAPS: 25.0000 mg | ORAL_CAPSULE | Freq: Four times a day (QID) | ORAL | Status: DC | PRN

## 2023-12-29 MED ORDER — PRENATAL MULTIVITAMIN CH
1.0000 | ORAL_TABLET | Freq: Every day | ORAL | Status: DC
  Administered 2023-12-29 – 2023-12-31 (×3): 1 via ORAL
  Filled 2023-12-29 (×3): qty 1

## 2023-12-29 MED ORDER — WITCH HAZEL-GLYCERIN EX PADS: 1.0000 | MEDICATED_PAD | CUTANEOUS | Status: DC | PRN

## 2023-12-29 MED ORDER — ZOLPIDEM TARTRATE 5 MG PO TABS: 5.0000 mg | ORAL_TABLET | Freq: Every evening | ORAL | Status: DC | PRN

## 2023-12-29 MED ORDER — SIMETHICONE 80 MG PO CHEW: 80.0000 mg | CHEWABLE_TABLET | ORAL | Status: DC | PRN

## 2023-12-29 MED ORDER — SENNOSIDES-DOCUSATE SODIUM 8.6-50 MG PO TABS
2.0000 | ORAL_TABLET | Freq: Every day | ORAL | Status: DC
  Administered 2023-12-30 – 2023-12-31 (×2): 2 via ORAL
  Filled 2023-12-29 (×2): qty 2

## 2023-12-29 MED ORDER — DIBUCAINE (PERIANAL) 1 % EX OINT: 1.0000 | TOPICAL_OINTMENT | CUTANEOUS | Status: DC | PRN

## 2023-12-29 MED ORDER — OXYCODONE HCL 5 MG PO TABS: 5.0000 mg | ORAL_TABLET | Freq: Four times a day (QID) | ORAL | Status: DC | PRN

## 2023-12-29 MED ORDER — ONDANSETRON HCL 4 MG/2ML IJ SOLN: 4.0000 mg | INTRAMUSCULAR | Status: DC | PRN

## 2023-12-29 MED ORDER — MEDROXYPROGESTERONE ACETATE 150 MG/ML IM SUSP: 150.0000 mg | INTRAMUSCULAR | Status: DC | PRN

## 2023-12-29 MED ORDER — COCONUT OIL OIL
1.0000 | TOPICAL_OIL | Status: DC | PRN
  Administered 2023-12-31: 1 via TOPICAL

## 2023-12-29 MED ORDER — BENZOCAINE-MENTHOL 20-0.5 % EX AERO: 1.0000 | INHALATION_SPRAY | CUTANEOUS | Status: DC | PRN

## 2023-12-29 MED ORDER — OXYCODONE HCL 5 MG PO TABS
10.0000 mg | ORAL_TABLET | Freq: Four times a day (QID) | ORAL | Status: DC | PRN
  Administered 2023-12-29: 10 mg via ORAL
  Filled 2023-12-29: qty 2
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
## 2023-12-29 NOTE — Progress Notes (Signed)
 LABOR PROGRESS NOTE  Patient Name: Taylor Brennan, female   DOB: [birth year], 23 y.o.  MRN: [id number]  Cat 1 strip, after counseling pt agreeable to AROM.  Copious large gush of fluid.  Ensured no cord prolapse and allowed extra fluid to evacuate prior to ensure well engaged fetal head and no other presenting part.  Mom and baby tolerated well.  Pit as needed.    Augustin JAYSON Slade, MD

## 2023-12-29 NOTE — Progress Notes (Signed)
 Called to patient's room for iatrogenic hypotension following epidural.  As I arrived to bedside nurse was pulling ephedrine  and had already had patient repositioned and starting a peripheral bolus.  Notified mom of the situation.  Noticed heart rate still in the 60-80s so quickly moved patient to all fours. Ephedrine  given.  Improvement in FHT to 90s-100s so given Terb x 1. SVE showing 4.5/50/-2.  Improvement in maternal BP and FHT. Once both mom and baby recover from incident and second dose of PCN in will plan to AROM.  Pt understanding and agreeable.   Augustin Slade, MD Family Medicine - Obstetrics Fellow

## 2023-12-29 NOTE — Lactation Note (Signed)
 This note was copied from a baby's chart. Lactation Consultation Note  Patient Name: Taylor Brennan Today's Date: 12/29/2023 Age:22 hours Reason for consult: Initial assessment;Term  P3- MOB plans to breast feed and offer pumped breast milk. MOB requested to have the hospital DEBP set up because infant was hungry and she wanted to see her volume. MOB's RN set up the DEBP. LC flanged sized her at a 21mm flange. LC reviewed how to use it and how to clean it. LC pumped with MOB for 10 minutes and she collected 29 mL of colostrum. LC praised MOB for her volume. LC reviewed how to order a home DEBP through her insurance. LC provided her with a manual pump to take home. MOB requested some antiperspirant and LC provided this as well. When LC came back into the room, MOB was crying and disputing with FOB. LC informed the RN and she requested for this LC to inform the social worker.   LC reviewed the first 24 hr birthday nap, day 2 cluster feeding, feeding infant on cue 8-12x in 24 hrs, not allowing infant to go over 3 hrs without a feeding, CDC milk storage guidelines, LC services handout and engorgement/breast care. LC encouraged MOB to call for further assistance as needed.  Maternal Data Has patient been taught Hand Expression?: Yes Does the patient have breastfeeding experience prior to this delivery?: Yes How long did the patient breastfeed?: 3 months for both older children, first child had painful latches and with second child she developed mastitis  Feeding Mother's Current Feeding Choice: Breast Milk  Lactation Tools Discussed/Used Tools: Pump;Flanges Flange Size: 21 Breast pump type: Double-Electric Breast Pump;Manual Pump Education: Setup, frequency, and cleaning;Milk Storage Reason for Pumping: MOB request Pumping frequency: 15-20 min every 3 hrs Pumped volume: 29 mL  Interventions Interventions: Breast feeding basics reviewed;Expressed milk;Hand pump;DEBP;Education;LC Services  brochure  Discharge Discharge Education: Engorgement and breast care;Warning signs for feeding baby Pump: Manual;Advised to call insurance company  Consult Status Consult Status: Follow-up Date: 12/30/23 Follow-up type: In-patient    Recardo Hoit BS, IBCLC 12/29/2023, 3:51 PM

## 2023-12-29 NOTE — Discharge Summary (Signed)
 Postpartum Discharge Summary  Date of Service updated***     Patient Name: Taylor Brennan DOB: 2000/11/07 MRN: [id number]  Date of admission: 12/28/2023 Delivery date:12/29/2023 Delivering provider: NICHOLAUS ALMARIE HERO Date of discharge: 12/29/2023  Admitting diagnosis: Encounter for induction of labor [Z34.90] Intrauterine pregnancy: [redacted]w[redacted]d     Secondary diagnosis:  Principal Problem:   Encounter for induction of labor  Additional problems:  Patient Active Problem List    Diagnosis Date Noted   Encounter for induction of labor 12/28/2023   Unspecified mood (affective) disorder (HCC) 09/30/2023   Bipolar 1 disorder, depressed (HCC) 09/29/2023   Obesity affecting pregnancy, antepartum 07/27/2023   PTSD (post-traumatic stress disorder) 07/21/2023   Supervision of other normal pregnancy, antepartum 06/09/2023   Migraine without aura and without status migrainosus, not intractable 06/01/2023   Bipolar 1 disorder, depressed, severe (HCC) 06/08/2019   Cannabis use disorder, mild, in early remission 06/08/2019   Suicide attempt (HCC) 06/08/2019     Discharge diagnosis: {DX.:23714}                                              Post partum procedures:{Postpartum procedures:23558} Augmentation: AROM Complications: None  Hospital course: Taylor Brennan is a 23 y.o. female G42P2002 with IUP at [redacted]w[redacted]d by US  presenting for SOL. Labor augmented with AROM.   Magnesium Sulfate received: No BMZ received: No Rhophylac:No MMR:No T-DaP:Given prenatally Flu: Yes RSV Vaccine received: No Transfusion:{Transfusion received:30440034}  Immunizations received: Immunization History  Administered Date(s) Administered   DTaP 05/30/2002, 07/28/2002, 03/27/2003, 07/06/2003, 04/15/2004, 03/26/2007   HIB (PRP-OMP) 05/30/2002, 07/28/2002, 03/27/2003, 07/06/2003   HPV 9-valent 04/27/2014   HPV Quadrivalent 01/18/2013, 11/08/2013   Hepatitis A 03/26/2007   Hepatitis A, Ped/Adol-2 Dose 11/08/2013    Hepatitis B August 02, 2001, 05/30/2002, 03/27/2003   IPV 05/30/2002, 07/28/2002, 03/27/2003, 03/26/2007   Influenza Nasal 04/28/2008, 06/08/2008   Influenza,inj,Quad PF,6+ Mos 05/10/2015, 09/08/2016, 08/14/2017, 04/17/2022   MMR 03/27/2003, 03/26/2007   Meningococcal Conjugate 11/08/2013   Pneumococcal-Unspecified 05/30/2002, 07/28/2002, 07/06/2003   Rsv, Bivalent, Protein Subunit Rsvpref,pf Marlow) 06/02/2022   Tdap 01/18/2013, 04/17/2022, 10/06/2023   Varicella 07/06/2003, 03/26/2007    Physical exam  Vitals:   12/29/23 1017 12/29/23 1031 12/29/23 1045 12/29/23 1101  BP: (!) 118/59 117/69 113/67 (!) 115/55  Pulse: (!) 122 (!) 113 (!) 102 (!) 114  Resp:  18 16   Temp:  98.3 F (36.8 C)    TempSrc:  Oral    SpO2:      Weight:      Height:       General: {Exam; general:21111117} Lochia: {Desc; appropriate/inappropriate:30686::appropriate} Uterine Fundus: {Desc; firm/soft:30687} Incision: {Exam; incision:21111123} DVT Evaluation: {Exam; dvt:2111122} Labs: Lab Results  Component Value Date   WBC 9.5 12/28/2023   HGB 12.4 12/28/2023   HCT 36.9 12/28/2023   MCV 98.4 12/28/2023   PLT 280 12/28/2023      Latest Ref Rng & Units 11/18/2023   11:24 PM  CMP  Glucose 70 - 99 mg/dL 880   BUN 6 - 20 mg/dL 6   Creatinine 9.55 - 8.99 mg/dL 9.22   Sodium 864 - 854 mmol/L 133   Potassium 3.5 - 5.1 mmol/L 3.7   Chloride 98 - 111 mmol/L 104   CO2 22 - 32 mmol/L 19   Calcium 8.9 - 10.3 mg/dL 9.0   Total Protein 6.5 - 8.1 g/dL 6.4  Total Bilirubin 0.0 - 1.2 mg/dL 0.5   Alkaline Phos 38 - 126 U/L 84   AST 15 - 41 U/L 22   ALT 0 - 44 U/L 15    Edinburgh Score:    05/28/2021    3:15 PM  Edinburgh Postnatal Depression Scale Screening Tool  I have been able to laugh and see the funny side of things. 0  I have looked forward with enjoyment to things. 0  I have blamed myself unnecessarily when things went wrong. 1  I have been anxious or worried for no good reason. 2  I have felt  scared or panicky for no good reason. 1  Things have been getting on top of me. 0  I have been so unhappy that I have had difficulty sleeping. 1  I have felt sad or miserable. 0  I have been so unhappy that I have been crying. 1  The thought of harming myself has occurred to me. 0  Edinburgh Postnatal Depression Scale Total 6      Data saved with a previous flowsheet row definition   No data recorded  After visit meds:  Allergies as of 12/29/2023   No Known Allergies   Med Rec must be completed prior to using this [facility]***        Discharge home in stable condition Infant Feeding: Breast Infant Disposition:home with mother Discharge instruction: per After Visit Summary and Postpartum booklet. Activity: Advance as tolerated. Pelvic rest for 6 weeks.  Diet: routine diet Future Appointments: Future Appointments  Date Time Provider Department Center  01/28/2024  9:00 AM Cozart, Carlyon GRADE, LCSW [facility] None  02/26/2024 11:00 AM Starkes-Perry, Majel RAMAN, FNP [facility] None   Follow up Visit:   Please schedule this patient for a In person postpartum visit in 6 weeks with the following provider: Any provider. Additional Postpartum F/U:Postpartum Depression checkup  Low risk pregnancy complicated by: maternal mood disorders (bipolar, depression, hx SI) Delivery mode:  Vaginal, Spontaneous Anticipated Birth Control:  Nexplanon    12/29/2023 Brennan Taylor Sharps, MD

## 2023-12-29 NOTE — Anesthesia Procedure Notes (Signed)
 Epidural Patient location during procedure: OB Start time: 12/29/2023 1:15 AM End time: 12/29/2023 1:31 AM  Staffing Anesthesiologist: Cleotilde Butler Dade, MD Performed: anesthesiologist   Preanesthetic Checklist Completed: patient identified, IV checked, site marked, risks and benefits discussed, surgical consent, monitors and equipment checked, pre-op evaluation and timeout performed  Epidural Patient position: sitting Prep: ChloraPrep Patient monitoring: heart rate, cardiac monitor, continuous pulse ox and blood pressure Approach: midline Location: L2-L3 Injection technique: LOR saline  Needle:  Needle type: Tuohy  Needle gauge: 17 G Needle length: 9 cm Needle insertion depth: 6 cm Catheter type: closed end flexible Catheter size: 20 Guage Catheter at skin depth: 10 cm Test dose: negative  Assessment Events: blood not aspirated, injection not painful, no injection resistance, no paresthesia and negative IV test  Additional Notes Reason for block:procedure for pain

## 2023-12-29 NOTE — Anesthesia Preprocedure Evaluation (Signed)
 Anesthesia Evaluation  Patient identified by MRN, date of birth, ID band Patient awake    Reviewed: Allergy & Precautions, H&P , NPO status , Patient's Chart, lab work & pertinent test results  Airway Mallampati: II  TM Distance: >3 FB Neck ROM: Full    Dental no notable dental hx.    Pulmonary asthma , former smoker   Pulmonary exam normal breath sounds clear to auscultation       Cardiovascular negative cardio ROS Normal cardiovascular exam Rhythm:Regular Rate:Normal     Neuro/Psych  Headaches  Anxiety  Bipolar Disorder    negative psych ROS   GI/Hepatic negative GI ROS, Neg liver ROS,,,  Endo/Other  negative endocrine ROS    Renal/GU negative Renal ROS  negative genitourinary   Musculoskeletal negative musculoskeletal ROS (+)    Abdominal  (+) + obese  Peds negative pediatric ROS (+)  Hematology negative hematology ROS (+)   Anesthesia Other Findings   Reproductive/Obstetrics (+) Pregnancy                             Anesthesia Physical Anesthesia Plan  ASA: 2  Anesthesia Plan: Epidural   Post-op Pain Management:    Induction:   PONV Risk Score and Plan:   Airway Management Planned:   Additional Equipment:   Intra-op Plan:   Post-operative Plan:   Informed Consent:   Plan Discussed with:   Anesthesia Plan Comments:        Anesthesia Quick Evaluation

## 2023-12-29 NOTE — Anesthesia Postprocedure Evaluation (Signed)
 Anesthesia Post Note  Patient: Taylor Brennan  Procedure(s) Performed: AN AD HOC LABOR EPIDURAL     Patient location during evaluation: Mother Baby Anesthesia Type: Epidural Level of consciousness: awake, oriented and awake and alert Pain management: pain level controlled Vital Signs Assessment: post-procedure vital signs reviewed and stable Respiratory status: spontaneous breathing Cardiovascular status: stable Postop Assessment: no headache, no backache, adequate PO intake, able to ambulate and no apparent nausea or vomiting Anesthetic complications: no   No notable events documented.  Last Vitals:  Vitals:   12/29/23 1300 12/29/23 1700  BP: 106/82 115/73  Pulse: 68 89  Resp: 18 20  Temp: 36.6 C 36.7 C  SpO2: 98% 99%    Last Pain:  Vitals:   12/29/23 1958  TempSrc:   PainSc: 7    Pain Goal:                   Taylor Brennan
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
## 2023-12-30 NOTE — Progress Notes (Signed)
 POSTPARTUM PROGRESS NOTE  PPD#1  Subjective:  Taylor Brennan is a 23 y.o. [id number] s/p NSVD at [redacted]w[redacted]d. Today she notes she is doing well. She denies any problems with ambulating, voiding or po intake. Denies nausea or vomiting. She has not yet had a BM.  Pain is well controlled.  Lochia appropriate Denies fever/chills/chest pain/SOB.   Objective: Blood pressure 113/78, pulse 84, temperature 98 F (36.7 C), temperature source Oral, resp. rate 17, height 5' 2 (1.575 m), weight 89.9 kg, last menstrual period 03/28/2023, SpO2 98%, unknown if currently breastfeeding.  Physical Exam:  General: alert, cooperative and no distress Chest: no respiratory distress Heart: regular rate and rhythm Abdomen: soft, non tender Uterine Fundus: firm, below umbilcius DVT Evaluation: No calf swelling or tenderness Extremities: no edema Skin: warm, dry  No results found for this or any previous visit (from the past 24 hours).  Assessment/Plan: Madyson Lukach is a 23 y.o. [phone] s/p NSVD at [redacted]w[redacted]d PPD#1  -pain well controlled -meeting milestones appropriately  Contraception: changed in mind about Nexplanon , not sure at this time.  Reviewed all options, likely considering the pill Feeding: breast  Dispo: Continue routine postpartum care, plan for discharge home tomorrow   LOS: 2 days   Merritt Mccravy, DO Faculty Attending, [facility] 12/30/2023, 12:59 PM

## 2023-12-30 NOTE — Lactation Note (Signed)
 This note was copied from a baby's chart. Lactation Consultation Note  Patient Name: Taylor Brennan Date: 12/30/2023 Age:22 hours Reason for consult: Follow-up assessment;Mother's request;Term  P3- MOB requested to talk with this LC because she was concerned that she is not producing enough. MOB's RN informed LC that MOB requested formula due to this feeling. MOB was concerned that she recently pumped 15 mL of colostrum and infant still seemed hungry. LC reminded MOB of our conversation yesterday. LC reviewed how she is producing a lot right now and that it is normal to see it fluctuate until her milk is established. LC also reviewed cluster feeding with MOB again. LC reassured MOB that infant is not starving. LC encouraged MOB to continue latching infant to the breast and pumping as needed to offer EBM. MOB thanked [facility] and reported feeling much more confident. LC encouraged MOB to call for further assistance as needed.   Maternal Data Has patient been taught Hand Expression?: Yes Does the patient have breastfeeding experience prior to this delivery?: Yes  Feeding Mother's Current Feeding Choice: Breast Milk Nipple Type: Slow - flow  Lactation Tools Discussed/Used Tools: Pump;Flanges Flange Size: 21 Breast pump type: Double-Electric Breast Pump;Manual Pump Education: Setup, frequency, and cleaning;Milk Storage Reason for Pumping: MOB request Pumping frequency: 15-20 min every 3 hrs  Interventions Interventions: Breast feeding basics reviewed;Hand pump;DEBP;Education;LC Services brochure  Discharge Discharge Education: Engorgement and breast care;Warning signs for feeding baby Pump: Manual;Hands Free;Personal;Advised to call insurance company  Consult Status Consult Status: Follow-up Date: 12/31/23 Follow-up type: In-patient    Recardo Hoit BS, IBCLC 12/30/2023, 6:00 PM

## 2023-12-30 NOTE — Progress Notes (Shared)
 POSTPARTUM PROGRESS NOTE  Post Partum Day 1  Subjective:  Taylor Brennan is a 23 y.o. [id number] s/p SVD at [redacted]w[redacted]d.  She reports she is doing well. No acute events overnight. She denies any problems with ambulating, voiding or po intake. Denies nausea or vomiting.  Pain is {DESC; WELL/MODERATELY/POORLY:30679} controlled.  Lochia is ***.  Objective: Blood pressure 113/73, pulse 80, temperature 97.8 F (36.6 C), temperature source Oral, resp. rate 16, height 5' 2 (1.575 m), weight 89.9 kg, last menstrual period 03/28/2023, SpO2 100%, unknown if currently breastfeeding.  Physical Exam:  General: alert, cooperative and no distress Chest: no respiratory distress Heart:regular rate, distal pulses intact Uterine Fundus: firm, appropriately tender DVT Evaluation: No calf swelling or tenderness Extremities: *** edema Skin: warm, dry  Recent Labs    12/28/23 2119  HGB 12.4  HCT 36.9    Assessment/Plan: Latisa Mccreery is a 23 y.o. [id number] s/p SVD at [redacted]w[redacted]d   PPD#*** - Doing well  Routine postpartum care *** Contraception: *** Feeding: *** Dispo: Plan for discharge ***.   LOS: 2 days   Augustin JAYSON Slade, MD OB Fellow  12/30/2023, 4:44 AM

## 2023-12-31 MED ORDER — IBUPROFEN 800 MG PO TABS: 800.0000 mg | ORAL_TABLET | Freq: Three times a day (TID) | ORAL | 0 refills | Status: AC

## 2023-12-31 MED ORDER — BENZOCAINE-MENTHOL 20-0.5 % EX AERO: 1.0000 | INHALATION_SPRAY | CUTANEOUS | Status: DC | PRN

## 2023-12-31 MED ORDER — DIBUCAINE (PERIANAL) 1 % EX OINT: 1.0000 | TOPICAL_OINTMENT | CUTANEOUS | Status: DC | PRN

## 2023-12-31 MED ORDER — WITCH HAZEL-GLYCERIN EX PADS: 1.0000 | MEDICATED_PAD | CUTANEOUS | 12 refills | Status: AC | PRN

## 2023-12-31 MED ORDER — ACETAMINOPHEN 500 MG PO TABS: 1000.0000 mg | ORAL_TABLET | Freq: Three times a day (TID) | ORAL | Status: AC

## 2023-12-31 MED ORDER — COCONUT OIL OIL: 1.0000 | TOPICAL_OIL | Status: DC | PRN

## 2023-12-31 NOTE — Social Work (Signed)
 CSW acknowledged consult of MOB Edinburgh Postnatal Depression Screen score of 12. CSW has completed an assessment with MOB and addressed MH as well as provided resources.  CSW identifies no further need for intervention and no barriers to discharge at this time  Balraj Brayfield, [facility] Clinical Social Worker [phone]

## 2023-12-31 NOTE — Lactation Note (Signed)
 This note was copied from a baby's chart. Lactation Consultation Note  Patient Name: Taylor Brennan Unijb'd Date: 12/31/2023 Age:22 hours Reason for consult: Follow-up assessment  P3, Mother tearful when LC entered room. Mother states baby did not latch well and now her left nipple is sore.   FOB was giving baby a bottle of breastmilk when LC entered room.   Baby consumed 39 ml of breastmilk with slow flow bottle nipple.   Discussed paced feeding and suggest father burp baby half way through feeding.   Slight crack on tip of left nipple.  Provided coconut oil. Discussed waiting until baby has wide gape before latch and if she slips down on the tip of the nipple, unlatch and latch deeper.   Mother wanting to use her MomCozy pump.  Assisted with washing pump parts.   Suggest family call if they would like assistance with latching today.  Reviewed milk storage, engorgement care and monitoring stools.    Maternal Data Has patient been taught Hand Expression?: Yes Does the patient have breastfeeding experience prior to this delivery?: Yes  Feeding Mother's Current Feeding Choice: Breast Milk  Lactation Tools Discussed/Used Tools: Pump;Coconut oil Pumping frequency: q 3 hours for 15 min Pumped volume: 28 mL  Interventions Interventions: Education;Pace feeding  Discharge Discharge Education: Engorgement and breast care;Warning signs for feeding baby Pump: Personal;Hands Free (MomCozy)  Consult Status Consult Status: Complete Date: 12/31/23  Taylor Levorn Lemme  RN, IBCLC 12/31/2023, 9:02 AM

## 2024-01-04 ENCOUNTER — Inpatient Hospital Stay (HOSPITAL_COMMUNITY): Payer: MEDICAID

## 2024-01-04 ENCOUNTER — Inpatient Hospital Stay (HOSPITAL_COMMUNITY): Admission: RE | Admit: 2024-01-04 | Payer: MEDICAID | Source: Home / Self Care | Admitting: Obstetrics & Gynecology

## 2024-01-07 ENCOUNTER — Telehealth (HOSPITAL_COMMUNITY): Payer: Self-pay | Admitting: *Deleted

## 2024-01-07 DIAGNOSIS — Z1331 Encounter for screening for depression: Secondary | ICD-10-CM

## 2024-01-07 NOTE — Telephone Encounter (Signed)
 01/07/2024  Name: Taylor Brennan [facility] MRN: [id number] DOB: 2001-07-01  Reason for Call:  Transition of Care Hospital Discharge Call  Contact Status: Patient Contact Status: Complete  Language assistant needed: Interpreter Mode: Interpreter Not Needed        Follow-Up Questions: Do You Have Any Concerns About Your Health As You Heal From Delivery?: Yes What Concerns Do You Have About Your Health?: Notes that she is having burning with urination. Not have increased frequency or urgency. Advised patient to please call OB to be checked for UTI. Do You Have Any Concerns About Your Infants Health?: No  Edinburgh Postnatal Depression Scale:  In the Past 7 Days: I have been able to laugh and see the funny side of things.: As much as I always could I have looked forward with enjoyment to things.: As much as I ever did I have blamed myself unnecessarily when things went wrong.: Yes, some of the time I have been anxious or worried for no good reason.: Hardly ever I have felt scared or panicky for no good reason.: Yes, sometimes Things have been getting on top of me.: Yes, sometimes I haven't been coping as well as usual I have been so unhappy that I have had difficulty sleeping.: Not very often I have felt sad or miserable.: Not very often I have been so unhappy that I have been crying.: Only occasionally The thought of harming myself has occurred to me.: Never [place] Postnatal Depression Scale Total: (!) 10  PHQ2-9 Depression Scale:     Discharge Follow-up: Edinburgh score requires follow up?: Yes Provider notified of Edinburgh score?: Yes Have you already been referred for a counseling appointment?: No Patient was advised of the following resources:: Support Group, Breastfeeding Support Group  Post-discharge interventions: Reviewed Newborn Safe Sleep Practices Maternal Mental Health Resources provided [facility] referral in patient's chart  Mliss Sieve, RN 01/07/2024  13:38

## 2024-01-28 ENCOUNTER — Encounter (HOSPITAL_COMMUNITY): Payer: Self-pay

## 2024-01-28 ENCOUNTER — Ambulatory Visit (HOSPITAL_COMMUNITY): Payer: MEDICAID | Admitting: Clinical

## 2024-02-04 ENCOUNTER — Ambulatory Visit

## 2024-02-08 ENCOUNTER — Ambulatory Visit: Payer: MEDICAID

## 2024-02-08 ENCOUNTER — Other Ambulatory Visit (HOSPITAL_COMMUNITY)
Admission: RE | Admit: 2024-02-08 | Discharge: 2024-02-08 | Disposition: A | Discharge status: Home / Self Care | Payer: MEDICAID | Source: Ambulatory Visit | Attending: Obstetrics and Gynecology | Admitting: Obstetrics and Gynecology

## 2024-02-08 VITALS — BP 107/72 | HR 80 | Ht 61.0 in | Wt 178.0 lb

## 2024-02-08 DIAGNOSIS — N898 Other specified noninflammatory disorders of vagina: Secondary | ICD-10-CM | POA: Insufficient documentation

## 2024-02-08 NOTE — Progress Notes (Signed)
 SUBJECTIVE:  23 y.o. female complains of foul, grey, and itching, vaginal discharge for 7 day(s). Denies abnormal vaginal bleeding or significant pelvic pain or fever. No UTI symptoms. Denies history of known exposure to STD.  Patient's last menstrual period was 02/02/2024 (approximate).  OBJECTIVE:  She appears well, afebrile. Urine dipstick: not done.  ASSESSMENT:  Vaginal Discharge  Vaginal Odor   PLAN:  GC, chlamydia, trichomonas, BVAG, CVAG probe sent to lab. Treatment: To be determined once lab results are received ROV prn if symptoms persist or worsen.

## 2024-02-09 ENCOUNTER — Ambulatory Visit: Payer: Self-pay | Admitting: Obstetrics and Gynecology

## 2024-02-09 LAB — CERVICOVAGINAL ANCILLARY ONLY
Bacterial Vaginitis (gardnerella): POSITIVE — AB
Candida Glabrata: NEGATIVE
Candida Vaginitis: POSITIVE — AB
Chlamydia: NEGATIVE
Comment: NEGATIVE
Comment: NEGATIVE
Comment: NEGATIVE
Comment: NEGATIVE
Comment: NEGATIVE
Comment: NORMAL
Neisseria Gonorrhea: NEGATIVE
Trichomonas: NEGATIVE

## 2024-02-09 MED ORDER — METRONIDAZOLE 500 MG PO TABS: 500.0000 mg | ORAL_TABLET | Freq: Two times a day (BID) | ORAL | 0 refills | Status: DC

## 2024-02-09 MED ORDER — FLUCONAZOLE 150 MG PO TABS: 150.0000 mg | ORAL_TABLET | Freq: Once | ORAL | 0 refills | Status: AC

## 2024-02-22 ENCOUNTER — Ambulatory Visit: Payer: MEDICAID | Admitting: Nurse Practitioner

## 2024-02-22 ENCOUNTER — Telehealth: Payer: Self-pay | Admitting: Nurse Practitioner

## 2024-02-22 NOTE — Telephone Encounter (Signed)
 07/09/2023 no show 02/22/2024 no show  Final warning sent via mail and mychart

## 2024-02-23 NOTE — Telephone Encounter (Signed)
 Noted

## 2024-02-26 ENCOUNTER — Telehealth (HOSPITAL_COMMUNITY): Payer: MEDICAID | Admitting: Family

## 2024-02-26 ENCOUNTER — Telehealth (HOSPITAL_COMMUNITY): Payer: MEDICAID | Admitting: Psychiatry

## 2024-02-26 ENCOUNTER — Encounter (HOSPITAL_COMMUNITY): Payer: Self-pay

## 2024-03-02 ENCOUNTER — Emergency Department (HOSPITAL_COMMUNITY): Admission: EM | Admit: 2024-03-02 | Discharge: 2024-03-02 | Discharge status: Against Medical Advice | Payer: MEDICAID

## 2024-03-02 ENCOUNTER — Other Ambulatory Visit: Payer: Self-pay

## 2024-03-02 ENCOUNTER — Encounter (HOSPITAL_COMMUNITY): Payer: Self-pay | Admitting: Emergency Medicine

## 2024-03-02 DIAGNOSIS — Z202 Contact with and (suspected) exposure to infections with a predominantly sexual mode of transmission: Secondary | ICD-10-CM | POA: Diagnosis present

## 2024-03-02 DIAGNOSIS — Z5321 Procedure and treatment not carried out due to patient leaving prior to being seen by health care provider: Secondary | ICD-10-CM | POA: Insufficient documentation

## 2024-03-02 DIAGNOSIS — R109 Unspecified abdominal pain: Secondary | ICD-10-CM | POA: Insufficient documentation

## 2024-03-02 LAB — CBC WITH DIFFERENTIAL/PLATELET
Abs Immature Granulocytes: 0.01 K/uL (ref 0.00–0.07)
Basophils Absolute: 0 K/uL (ref 0.0–0.1)
Basophils Relative: 0 %
Eosinophils Absolute: 0.1 K/uL (ref 0.0–0.5)
Eosinophils Relative: 1 %
HCT: 43.3 % (ref 36.0–46.0)
Hemoglobin: 14.4 g/dL (ref 12.0–15.0)
Immature Granulocytes: 0 %
Lymphocytes Relative: 62 %
Lymphs Abs: 3.2 K/uL (ref 0.7–4.0)
MCH: 32 pg (ref 26.0–34.0)
MCHC: 33.3 g/dL (ref 30.0–36.0)
MCV: 96.2 fL (ref 80.0–100.0)
Monocytes Absolute: 0.3 K/uL (ref 0.1–1.0)
Monocytes Relative: 5 %
Neutro Abs: 1.7 K/uL (ref 1.7–7.7)
Neutrophils Relative %: 32 %
Platelets: 302 K/uL (ref 150–400)
RBC: 4.5 MIL/uL (ref 3.87–5.11)
RDW: 12.5 % (ref 11.5–15.5)
WBC: 5.2 K/uL (ref 4.0–10.5)
nRBC: 0 % (ref 0.0–0.2)

## 2024-03-02 LAB — COMPREHENSIVE METABOLIC PANEL WITH GFR
ALT: 16 U/L (ref 0–44)
AST: 18 U/L (ref 15–41)
Albumin: 4.1 g/dL (ref 3.5–5.0)
Alkaline Phosphatase: 59 U/L (ref 38–126)
Anion gap: 10 (ref 5–15)
BUN: 12 mg/dL (ref 6–20)
CO2: 24 mmol/L (ref 22–32)
Calcium: 9.8 mg/dL (ref 8.9–10.3)
Chloride: 106 mmol/L (ref 98–111)
Creatinine, Ser: 1.06 mg/dL — ABNORMAL HIGH (ref 0.44–1.00)
GFR, Estimated: >60 mL/min (ref >60)
Glucose, Bld: 85 mg/dL (ref 70–99)
Potassium: 4.2 mmol/L (ref 3.5–5.1)
Sodium: 140 mmol/L (ref 135–145)
Total Bilirubin: 0.8 mg/dL (ref 0.0–1.2)
Total Protein: 7.9 g/dL (ref 6.5–8.1)

## 2024-03-02 LAB — URINALYSIS, ROUTINE W REFLEX MICROSCOPIC
Bilirubin Urine: NEGATIVE
Glucose, UA: NEGATIVE mg/dL
Hgb urine dipstick: NEGATIVE
Ketones, ur: 5 mg/dL — AB
Nitrite: NEGATIVE
Protein, ur: NEGATIVE mg/dL
Specific Gravity, Urine: 1.021 (ref 1.005–1.030)
pH: 6 (ref 5.0–8.0)

## 2024-03-02 LAB — HIV ANTIBODY (ROUTINE TESTING W REFLEX): HIV Screen 4th Generation wRfx: NONREACTIVE

## 2024-03-02 LAB — HCG, SERUM, QUALITATIVE: Preg, Serum: NEGATIVE

## 2024-03-02 LAB — LIPASE, BLOOD: Lipase: 29 U/L (ref 11–51)

## 2024-03-02 NOTE — ED Triage Notes (Signed)
 Reports needing std testing and having pain with urination.

## 2024-03-02 NOTE — ED Triage Notes (Addendum)
 Pt reports possible STD. Pt had baby 6 weeks ago. Endorses brown discharge. Intermittent pelvic pain.   Pt added she has boils under both arms that she would like checked.

## 2024-03-02 NOTE — ED Notes (Signed)
 Pt leaving the ER AMA

## 2024-03-02 NOTE — ED Provider Triage Note (Signed)
 Emergency Medicine Provider Triage Evaluation Note  Taylor Brennan , a 23 y.o. female  was evaluated in triage.  Pt complains of episodic abdominal pain, wanting STD testing, boils under both arms, as well as pain with urination.  Patient states that she had a baby 6 weeks ago.  Reports brown abnormal discharge currently.  Denies fever, chills, current abdominal pain.  Review of Systems  Positive: Episodic abdominal pain, abnormal discharge, dysuria Negative: Fever, chills  Physical Exam  BP (!) 124/95 (BP Location: Right Arm)   Pulse 79   Temp 98.3 F (36.8 C)   Resp 17   Ht 5' 1 (1.549 m)   Wt 77.1 kg   LMP 02/21/2024 (Approximate)   SpO2 100%   Breastfeeding Yes   BMI 32.12 kg/m  Gen:   Awake, no distress   Resp:  Normal effort  MSK:   Moves extremities without difficulty  Other:  Abdomen currently nontender, small soft tissue masses present to bilateral axilla area, suspect possibly enlarged lymph nodes, no appreciable abscess or fluctuance, no surrounding erythema  Medical Decision Making  Medically screening exam initiated at 6:42 PM.  Appropriate orders placed.  Taylor Brennan was informed that the remainder of the evaluation will be completed by another provider, this initial triage assessment does not replace that evaluation, and the importance of remaining in the ED until their evaluation is complete.  Orders: CBC, CMP, lipase, HIV antibody, pregnancy, urinalysis, wet prep, gonorrhea chlamydia, RPR   Taylor Terrall FALCON, PA-C 03/02/24 1845

## 2024-03-03 ENCOUNTER — Ambulatory Visit: Payer: MEDICAID

## 2024-03-03 LAB — SYPHILIS: RPR W/REFLEX TO RPR TITER AND TREPONEMAL ANTIBODIES, TRADITIONAL SCREENING AND DIAGNOSIS ALGORITHM: RPR Ser Ql: NONREACTIVE

## 2024-03-04 LAB — GC/CHLAMYDIA PROBE AMP (~~LOC~~) NOT AT ARMC
Chlamydia: NEGATIVE
Comment: NEGATIVE
Comment: NORMAL
Neisseria Gonorrhea: NEGATIVE

## 2024-03-16 ENCOUNTER — Ambulatory Visit: Payer: MEDICAID | Admitting: Nurse Practitioner

## 2024-03-21 ENCOUNTER — Ambulatory Visit: Payer: MEDICAID | Admitting: Nurse Practitioner

## 2024-03-23 ENCOUNTER — Telehealth: Payer: Self-pay | Admitting: Nurse Practitioner

## 2024-03-23 NOTE — Telephone Encounter (Signed)
 07/09/2023 no show 02/22/2024 no show 03/16/2024 no show 03/21/2024 no show  Pt is scheduled for 04/01/2024 - last visit with [facility]. Dismissal generated and sent via mail and mychart.

## 2024-04-01 ENCOUNTER — Encounter: Payer: Self-pay | Admitting: Nurse Practitioner

## 2024-04-01 ENCOUNTER — Other Ambulatory Visit (HOSPITAL_COMMUNITY)
Admission: RE | Admit: 2024-04-01 | Discharge: 2024-04-01 | Disposition: A | Discharge status: Home / Self Care | Payer: MEDICAID | Source: Ambulatory Visit | Attending: Nurse Practitioner | Admitting: Nurse Practitioner

## 2024-04-01 ENCOUNTER — Ambulatory Visit (INDEPENDENT_AMBULATORY_CARE_PROVIDER_SITE_OTHER): Payer: MEDICAID | Admitting: Nurse Practitioner

## 2024-04-01 VITALS — BP 102/64 | Temp 97.3°F | Ht 61.0 in | Wt 170.8 lb

## 2024-04-01 DIAGNOSIS — N898 Other specified noninflammatory disorders of vagina: Secondary | ICD-10-CM

## 2024-04-01 DIAGNOSIS — B3731 Acute candidiasis of vulva and vagina: Secondary | ICD-10-CM

## 2024-04-01 DIAGNOSIS — N760 Acute vaginitis: Secondary | ICD-10-CM | POA: Diagnosis not present

## 2024-04-01 DIAGNOSIS — B9689 Other specified bacterial agents as the cause of diseases classified elsewhere: Secondary | ICD-10-CM

## 2024-04-01 DIAGNOSIS — R300 Dysuria: Secondary | ICD-10-CM | POA: Diagnosis not present

## 2024-04-01 LAB — POCT URINALYSIS DIPSTICK
Bilirubin, UA: NEGATIVE
Blood, UA: POSITIVE
Glucose, UA: NEGATIVE — AB
Ketones, UA: NEGATIVE
Leukocytes, UA: NEGATIVE
Nitrite, UA: NEGATIVE
Protein, UA: NEGATIVE
Spec Grav, UA: 1.015 (ref 1.010–1.025)
Urobilinogen, UA: 0.2 U/dL — AB
pH, UA: 7 (ref 5.0–8.0)

## 2024-04-01 MED ORDER — FLUCONAZOLE 150 MG PO TABS: ORAL_TABLET | ORAL | 0 refills | Status: AC

## 2024-04-01 MED ORDER — NITROFURANTOIN MONOHYD MACRO 100 MG PO CAPS: 100.0000 mg | ORAL_CAPSULE | Freq: Two times a day (BID) | ORAL | 0 refills | Status: DC

## 2024-04-01 MED ORDER — METRONIDAZOLE 500 MG PO TABS: 500.0000 mg | ORAL_TABLET | Freq: Two times a day (BID) | ORAL | 0 refills | Status: AC

## 2024-04-01 NOTE — Progress Notes (Signed)
 Acute Office Visit  Subjective:     Patient ID: Taylor Brennan, female    DOB: 03/15/2001, 23 y.o.   MRN: [id number]  Chief Complaint  Patient presents with   Vaginal Discharge    With pain with urination for 2 months   HPI:  Discussed the use of AI scribe software for clinical note transcription with the patient, who gave verbal consent to proceed.  History of Present Illness   Taylor Brennan is a 23 year old female who presents with symptoms of bacterial vaginosis, yeast infection, and urinary tract infection.  Symptoms began after resuming sexual activity post-delivery of her daughter on June 24th. She experiences gray discharge before menstruation and black discharge during her cycle. A month ago, STI tests were negative, but bacterial vaginosis and yeast infection were confirmed on August 4th without subsequent treatment. Symptoms persist, including discharge and discomfort. Azo was ineffective for urinary symptoms, and she currently experiences pain. Nausea and back pain are present, attributed to recent childbirth. She is not pregnant, confirmed by a recent hospital visit.     ROS See pertinent positives and negatives per HPI.     Objective:    BP 102/64 (BP Location: Left Arm, Patient Position: Sitting;Bed low/side rails up, Cuff Size: Normal)   Temp (!) 97.3 F (36.3 C)   Ht 5' 1 (1.549 m)   Wt 170 lb 12.8 oz (77.5 kg)   LMP 03/27/2024 (Approximate)   Breastfeeding No   BMI 32.27 kg/m    Physical Exam Vitals and nursing note reviewed.  Constitutional:      General: She is not in acute distress.    Appearance: Normal appearance.  HENT:     Head: Normocephalic.  Eyes:     Conjunctiva/sclera: Conjunctivae normal.  Cardiovascular:     Rate and Rhythm: Normal rate and regular rhythm.     Pulses: Normal pulses.     Heart sounds: Normal heart sounds.  Pulmonary:     Effort: Pulmonary effort is normal.     Breath sounds: Normal breath sounds.   Abdominal:     Palpations: Abdomen is soft.     Tenderness: There is no abdominal tenderness.  Musculoskeletal:     Cervical back: Normal range of motion.  Skin:    General: Skin is warm.  Neurological:     General: No focal deficit present.     Mental Status: She is alert and oriented to person, place, and time.  Psychiatric:        Mood and Affect: Mood normal.        Behavior: Behavior normal.        Thought Content: Thought content normal.        Judgment: Judgment normal.     Results for orders placed or performed in visit on 04/01/24  POCT urinalysis dipstick  Result Value Ref Range   Color, UA     Clarity, UA     Glucose, UA Negative (A) Negative   Bilirubin, UA Negative    Ketones, UA Negative    Spec Grav, UA 1.015 1.010 - 1.025   Blood, UA Positive    pH, UA 7.0 5.0 - 8.0   Protein, UA Negative Negative   Urobilinogen, UA 0.2 (A) 0.2 or 1.0 E.U./dL   Nitrite, UA negative    Leukocytes, UA Negative Negative   Appearance     Odor          Assessment & Plan:   Problem List Items  Addressed This Visit   None Visit Diagnoses       Vaginal discharge    -  Primary   Check swab for BV, yeast, gonorrhea, chlamydia. She did have a test a month ago that was positive and not treated. Will treat as below.   Relevant Orders   Cervicovaginal ancillary only   POCT urinalysis dipstick (Completed)     Dysuria       Suspected based on symptoms with inconclusive urinalysis. Prefers oral medication. Prescribe nitrofurantoin  BID x5 days. Send urine for culture to confirm UTI.   Relevant Orders   POCT urinalysis dipstick (Completed)   Urine Culture     BV (bacterial vaginosis)       Confirmed by a previous positive test. Prefers oral medication. Prescribe metronidazole  500 mg orally twice a day for 7 days.   Relevant Medications   metroNIDAZOLE  (FLAGYL ) 500 MG tablet   fluconazole  (DIFLUCAN ) 150 MG tablet   nitrofurantoin , macrocrystal-monohydrate, (MACROBID ) 100 MG  capsule     Yeast vaginitis       Confirmed by a previous positive test. Prefers oral medication. Start fluconazole  150 mg orally, one tablet today & another in 7 days after completing flagyl    Relevant Medications   metroNIDAZOLE  (FLAGYL ) 500 MG tablet   fluconazole  (DIFLUCAN ) 150 MG tablet   nitrofurantoin , macrocrystal-monohydrate, (MACROBID ) 100 MG capsule       Meds ordered this encounter  Medications   metroNIDAZOLE  (FLAGYL ) 500 MG tablet    Sig: Take 1 tablet (500 mg total) by mouth 2 (two) times daily.    Dispense:  14 tablet    Refill:  0   fluconazole  (DIFLUCAN ) 150 MG tablet    Sig: Take 1 tablet today and second tablet after finishing metronidazole     Dispense:  2 tablet    Refill:  0   nitrofurantoin , macrocrystal-monohydrate, (MACROBID ) 100 MG capsule    Sig: Take 1 capsule (100 mg total) by mouth 2 (two) times daily.    Dispense:  10 capsule    Refill:  0    Return if symptoms worsen or fail to improve.  Tinnie DELENA Harada, NP

## 2024-04-01 NOTE — Patient Instructions (Signed)
 It was great to see you!  Start flagyl  twice a day for 7 days for BV  Start macrobid  twice a day for 5 days for UTI  Start diflucan  1 tablet today and 1 tablet in 7 days after finishing flagyl 

## 2024-04-03 LAB — URINE CULTURE
MICRO NUMBER:: 17022884
SPECIMEN QUALITY:: ADEQUATE

## 2024-04-04 ENCOUNTER — Ambulatory Visit: Payer: Self-pay | Admitting: Nurse Practitioner

## 2024-04-04 LAB — CERVICOVAGINAL ANCILLARY ONLY
Bacterial Vaginitis (gardnerella): POSITIVE — AB
Candida Glabrata: NEGATIVE
Candida Vaginitis: NEGATIVE
Chlamydia: NEGATIVE
Comment: NEGATIVE
Comment: NEGATIVE
Comment: NEGATIVE
Comment: NEGATIVE
Comment: NEGATIVE
Comment: NORMAL
Neisseria Gonorrhea: NEGATIVE
Trichomonas: NEGATIVE

## 2024-04-04 MED ORDER — SULFAMETHOXAZOLE-TRIMETHOPRIM 800-160 MG PO TABS: 1.0000 | ORAL_TABLET | Freq: Two times a day (BID) | ORAL | 0 refills | Status: AC

## 2024-04-13 ENCOUNTER — Encounter: Payer: MEDICAID | Admitting: Licensed Clinical Social Worker

## 2024-04-25 ENCOUNTER — Ambulatory Visit: Payer: Self-pay

## 2024-04-25 NOTE — Telephone Encounter (Signed)
 FYI Only or Action Required?: Action required by provider: request for appointment.  Patient was last seen in primary care on 04/01/2024 by Nedra Tinnie LABOR, NP.  Called Nurse Triage reporting Urinary Frequency.  Symptoms began several weeks ago.  Interventions attempted: Nothing.  Symptoms are: stable.  Triage Disposition: See PCP Within 2 Weeks  Patient/caregiver understands and will follow disposition?: Yes   Reason for Disposition  All other urine symptoms  Answer Assessment - Initial Assessment Questions Patient seen by Tinnie Nedra on 9/26 , would not allow scheduling. Please reach out to patient to schedule. Advised UC as well if things worsen  1. SYMPTOM: What's the main symptom you're concerned about? (e.g., frequency, incontinence)     Urinary frequency, burning sensation when finished  2. ONSET: When did the  frequency/burning  start?     A month ago  3. PAIN: Is there any pain? If Yes, ask: How bad is it? (Scale: 1-10; mild, moderate, severe)     Moderate burning sensation when finished urinating  4. CAUSE: What do you think is causing the symptoms?     UTI or STD  5. OTHER SYMPTOMS: Do you have any other symptoms? (e.g., blood in urine, fever, flank pain, pain with urination)     White, very thick vaginal discharge. Abnormal odor.  Protocols used: Urinary Symptoms-A-AH Message from Duncan R sent at 04/25/2024  5:41 PM EDT  Reason for Triage: extreme UTI (vaginal)

## 2024-04-26 NOTE — Telephone Encounter (Signed)
 Noted. Patient was triaged and advised to be seen within 2 weeks.

## 2024-05-26 ENCOUNTER — Ambulatory Visit
Admission: EM | Admit: 2024-05-26 | Discharge: 2024-05-26 | Disposition: A | Discharge status: Home / Self Care | Payer: MEDICAID | Attending: Nurse Practitioner | Admitting: Nurse Practitioner

## 2024-05-26 DIAGNOSIS — J029 Acute pharyngitis, unspecified: Secondary | ICD-10-CM

## 2024-05-26 DIAGNOSIS — B349 Viral infection, unspecified: Secondary | ICD-10-CM | POA: Diagnosis not present

## 2024-05-26 LAB — POC COVID19/FLU A&B COMBO
Covid Antigen, POC: NEGATIVE
Influenza A Antigen, POC: NEGATIVE
Influenza B Antigen, POC: NEGATIVE

## 2024-05-26 LAB — POCT RAPID STREP A (OFFICE): Rapid Strep A Screen: NEGATIVE

## 2024-05-26 MED ORDER — AZELASTINE HCL 0.1 % NA SOLN: 1.0000 | Freq: Two times a day (BID) | NASAL | 0 refills | Status: AC | PRN
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
## 2024-05-26 NOTE — Discharge Instructions (Addendum)
 You tested negative for strep throat, COVID and flu.  Please treat your symptoms with over the counter cough medication, tylenol  or ibuprofen , humidifier, and rest.  You may use the nasal spray twice daily as needed for congestion.  Viral illnesses can last 7-14 days. Please follow up with your PCP if your symptoms are not improving. Please go to the ER for any worsening symptoms. This includes but is not limited to fever you can not control with tylenol  or ibuprofen , you are not able to stay hydrated, you have shortness of breath or chest pain.  Thank you for choosing [facility] for your healthcare needs. I hope you feel better soon!

## 2024-05-26 NOTE — ED Triage Notes (Signed)
 Pt present with a sore throat x 3 days. States she woke up feeling worse today.  C/o pain when swallowing.

## 2024-06-28 ENCOUNTER — Emergency Department (HOSPITAL_BASED_OUTPATIENT_CLINIC_OR_DEPARTMENT_OTHER)
Admission: EM | Admit: 2024-06-28 | Discharge: 2024-06-28 | Disposition: A | Discharge status: Home / Self Care | Payer: MEDICAID | Attending: Emergency Medicine | Admitting: Emergency Medicine

## 2024-06-28 ENCOUNTER — Other Ambulatory Visit: Payer: Self-pay

## 2024-06-28 DIAGNOSIS — J101 Influenza due to other identified influenza virus with other respiratory manifestations: Secondary | ICD-10-CM | POA: Diagnosis not present

## 2024-06-28 DIAGNOSIS — R059 Cough, unspecified: Secondary | ICD-10-CM | POA: Diagnosis present

## 2024-06-28 DIAGNOSIS — R112 Nausea with vomiting, unspecified: Secondary | ICD-10-CM

## 2024-06-28 LAB — PREGNANCY, URINE: Preg Test, Ur: NEGATIVE

## 2024-06-28 LAB — COMPREHENSIVE METABOLIC PANEL WITH GFR
ALT: 13 U/L (ref 0–44)
AST: 17 U/L (ref 15–41)
Albumin: 4.5 g/dL (ref 3.5–5.0)
Alkaline Phosphatase: 67 U/L (ref 38–126)
Anion gap: 12 (ref 5–15)
BUN: 9 mg/dL (ref 6–20)
CO2: 24 mmol/L (ref 22–32)
Calcium: 9.5 mg/dL (ref 8.9–10.3)
Chloride: 104 mmol/L (ref 98–111)
Creatinine, Ser: 0.73 mg/dL (ref 0.44–1.00)
GFR, Estimated: >60 mL/min
Glucose, Bld: 77 mg/dL (ref 70–99)
Potassium: 3.5 mmol/L (ref 3.5–5.1)
Sodium: 139 mmol/L (ref 135–145)
Total Bilirubin: 0.4 mg/dL (ref 0.0–1.2)
Total Protein: 7.9 g/dL (ref 6.5–8.1)

## 2024-06-28 LAB — RESP PANEL BY RT-PCR (RSV, FLU A&B, COVID)  RVPGX2
Influenza A by PCR: POSITIVE — AB
Influenza B by PCR: NEGATIVE
Resp Syncytial Virus by PCR: NEGATIVE
SARS Coronavirus 2 by RT PCR: NEGATIVE

## 2024-06-28 LAB — URINALYSIS, ROUTINE W REFLEX MICROSCOPIC
Bacteria, UA: NONE SEEN
Bilirubin Urine: NEGATIVE
Glucose, UA: NEGATIVE mg/dL
Nitrite: NEGATIVE
Protein, ur: NEGATIVE mg/dL
RBC / HPF: >50 RBC/hpf (ref 0–5)
Specific Gravity, Urine: 1.018 (ref 1.005–1.030)
pH: 6.5 (ref 5.0–8.0)

## 2024-06-28 LAB — LIPASE, BLOOD: Lipase: 21 U/L (ref 11–51)

## 2024-06-28 LAB — CBC
HCT: 41.5 % (ref 36.0–46.0)
Hemoglobin: 14.3 g/dL (ref 12.0–15.0)
MCH: 33.3 pg (ref 26.0–34.0)
MCHC: 34.5 g/dL (ref 30.0–36.0)
MCV: 96.7 fL (ref 80.0–100.0)
Platelets: 269 K/uL (ref 150–400)
RBC: 4.29 MIL/uL (ref 3.87–5.11)
RDW: 12.1 % (ref 11.5–15.5)
WBC: 4.6 K/uL (ref 4.0–10.5)
nRBC: 0 % (ref 0.0–0.2)

## 2024-06-28 MED ORDER — BENZONATATE 100 MG PO CAPS: 100.0000 mg | ORAL_CAPSULE | Freq: Three times a day (TID) | ORAL | 0 refills | Status: AC

## 2024-06-28 MED ORDER — ONDANSETRON 4 MG PO TBDP: 4.0000 mg | ORAL_TABLET | Freq: Three times a day (TID) | ORAL | 0 refills | Status: AC | PRN

## 2024-06-28 MED ORDER — ONDANSETRON 4 MG PO TBDP
4.0000 mg | ORAL_TABLET | Freq: Once | ORAL | Status: AC
  Administered 2024-06-28: 4 mg via ORAL
  Filled 2024-06-28: qty 1

## 2024-06-28 NOTE — ED Triage Notes (Signed)
 Abdominal pain. Cannot tolerate PO fluids/foods. Afebrile.

## 2024-06-28 NOTE — ED Provider Notes (Signed)
 " [place] EMERGENCY DEPARTMENT AT [facility] Provider Note   CSN: [id number] Arrival date & time: 06/28/24  2045     Patient presents with: Emesis and Abdominal Pain   Taylor Brennan is a 23 y.o. female here for evaluation of feeling unwell.  Patient with 2 days of chills, congestion, rhinorrhea, cough, nausea and vomiting.  Multiple family members with similar symptoms.  No focal abdominal pain.  No alcohol or marijuana use.  No dysuria or hematuria.  She is currently on her menstrual cycle.   HPI     Prior to Admission medications  Medication Sig Start Date End Date Taking? Authorizing Provider  benzonatate  (TESSALON ) 100 MG capsule Take 1 capsule (100 mg total) by mouth every 8 (eight) hours. 06/28/24  Yes Sigmond Patalano A, PA-C  ondansetron  (ZOFRAN -ODT) 4 MG disintegrating tablet Take 1 tablet (4 mg total) by mouth every 8 (eight) hours as needed. 06/28/24  Yes Rayya Yagi A, PA-C  acetaminophen  (TYLENOL ) 500 MG tablet Take 2 tablets (1,000 mg total) by mouth every 8 (eight) hours. 12/31/23   Eldonna Suzen Octave, MD  azelastine  (ASTELIN ) 0.1 % nasal spray Place 1 spray into both nostrils 2 (two) times daily as needed for rhinitis. Use in each nostril as directed 05/26/24   Mayer, Jodi R, NP  fluconazole  (DIFLUCAN ) 150 MG tablet Take 1 tablet today and second tablet after finishing metronidazole  04/01/24   McElwee, Lauren A, NP  ibuprofen  (ADVIL ) 800 MG tablet Take 1 tablet (800 mg total) by mouth every 8 (eight) hours. 12/31/23   Eldonna Suzen Octave, MD  lamoTRIgine  (LAMICTAL ) 100 MG tablet Take 1 tablet (100 mg total) by mouth daily. 12/10/23   Harl Zane BRAVO, NP  metroNIDAZOLE  (FLAGYL ) 500 MG tablet Take 1 tablet (500 mg total) by mouth 2 (two) times daily. 04/01/24   McElwee, Tinnie LABOR, NP  Prenatal Vit-Fe Fumarate-FA (PRENATAL VITAMIN) 27-0.8 MG TABS Take 1 tablet by mouth daily. 04/29/23   Camry Robello A, PA-C  sulfamethoxazole -trimethoprim   (BACTRIM  DS) 800-160 MG tablet Take 1 tablet by mouth 2 (two) times daily. 04/04/24   McElwee, Tinnie LABOR, NP  witch hazel-glycerin  (TUCKS) pad Apply 1 Application topically as needed for hemorrhoids. Patient not taking: Reported on 04/01/2024 12/31/23   Eldonna Suzen Octave, MD    Allergies: Flagyl  [metronidazole ]    Review of Systems  Constitutional:  Positive for activity change, appetite change, chills, fatigue and fever.  HENT:  Positive for congestion, postnasal drip and rhinorrhea. Negative for sore throat.   Respiratory:  Positive for cough. Negative for shortness of breath.   Cardiovascular: Negative.   Gastrointestinal:  Positive for diarrhea, nausea and vomiting. Negative for abdominal distention, abdominal pain and constipation.  Genitourinary: Negative.   Musculoskeletal: Negative.   Skin: Negative.   Neurological: Negative.   All other systems reviewed and are negative.   Updated Vital Signs BP 105/81   Pulse 93   Temp 98.9 F (37.2 C)   Resp 19   SpO2 94%   Physical Exam Vitals and nursing note reviewed.  Constitutional:      General: She is not in acute distress.    Appearance: She is well-developed. She is not ill-appearing, toxic-appearing or diaphoretic.  HENT:     Head: Normocephalic and atraumatic.     Nose: Congestion and rhinorrhea present.     Mouth/Throat:     Mouth: Mucous membranes are moist.     Pharynx: No oropharyngeal exudate or posterior oropharyngeal erythema.  Eyes:  Pupils: Pupils are equal, round, and reactive to light.  Cardiovascular:     Rate and Rhythm: Normal rate.     Pulses: Normal pulses.     Heart sounds: Normal heart sounds.  Pulmonary:     Effort: Pulmonary effort is normal. No respiratory distress.     Breath sounds: Normal breath sounds.  Abdominal:     General: Bowel sounds are normal. There is no distension.     Palpations: Abdomen is soft.     Tenderness: There is no abdominal tenderness. There is no right CVA  tenderness, left CVA tenderness, guarding or rebound.  Musculoskeletal:        General: Normal range of motion.     Cervical back: Normal range of motion.  Skin:    General: Skin is warm and dry.     Capillary Refill: Capillary refill takes less than 2 seconds.  Neurological:     General: No focal deficit present.     Mental Status: She is alert.     Cranial Nerves: No cranial nerve deficit.     Motor: No weakness.     Gait: Gait normal.  Psychiatric:        Mood and Affect: Mood normal.     (all labs ordered are listed, but only abnormal results are displayed) Labs Reviewed  RESP PANEL BY RT-PCR (RSV, FLU A&B, COVID)  RVPGX2 - Abnormal; Notable for the following components:      Result Value   Influenza A by PCR POSITIVE (*)    All other components within normal limits  URINALYSIS, ROUTINE W REFLEX MICROSCOPIC - Abnormal; Notable for the following components:   Hgb urine dipstick LARGE (*)    Ketones, ur TRACE (*)    Leukocytes,Ua TRACE (*)    All other components within normal limits  LIPASE, BLOOD  COMPREHENSIVE METABOLIC PANEL WITH GFR  CBC  PREGNANCY, URINE    EKG: None  Radiology: No results found.   Procedures   Medications Ordered in the ED  ondansetron  (ZOFRAN -ODT) disintegrating tablet 4 mg (4 mg Oral Given 06/28/24 9893)   23 year old here for evaluation of cough, congestion, rhinorrhea, nausea, vomiting, diarrhea, multiple sick contacts in the home.  Here she is afebrile, nonseptic, non-ill-appearing.  Abdomen soft, nontender.  No urinary symptoms.  Does not appear dehydrated.  Will plan on labs, antiemetic, reassess  Labs and imaging personally viewed and interpreted:  CBC without leukocytosis Metabolic panel without significant abnormality Lipase 21 UA negative for infection, does show blood however on menstrual cycle Pregnancy test neg Influenza A positive  Patient reassessed.  Tolerating p.o. intake.  Benign abdominal exam.  Symptoms likely  due to influenza.  Low suspicion for acute bacterial infectious process.  Will have her follow-up outpatient, return for new or worsening symptoms.  The patient has been appropriately medically screened and/or stabilized in the ED. I have low suspicion for any other emergent medical condition which would require further screening, evaluation or treatment in the ED or require inpatient management.  Patient is hemodynamically stable and in no acute distress.  Patient able to ambulate in department prior to ED.  Evaluation does not show acute pathology that would require ongoing or additional emergent interventions while in the emergency department or further inpatient treatment.  I have discussed the diagnosis with the patient and answered all questions.  Pain is been managed while in the emergency department and patient has no further complaints prior to discharge.  Patient is comfortable with plan discussed  in room and is stable for discharge at this time.  I have discussed strict return precautions for returning to the emergency department.  Patient was encouraged to follow-up with PCP/specialist refer to at discharge.                                   Medical Decision Making Amount and/or Complexity of Data Reviewed External Data Reviewed: labs, radiology and notes. Labs: ordered. Decision-making details documented in ED Course.  Risk OTC drugs. Prescription drug management. Decision regarding hospitalization. Diagnosis or treatment significantly limited by social determinants of health.       Final diagnoses:  Influenza A  Nausea and vomiting, unspecified vomiting type    ED Discharge Orders          Ordered    benzonatate  (TESSALON ) 100 MG capsule  Every 8 hours        06/28/24 2325    ondansetron  (ZOFRAN -ODT) 4 MG disintegrating tablet  Every 8 hours PRN        06/28/24 2325               Kaye Mitro A, PA-C 06/28/24 2331    Pamella Ozell LABOR, DO 07/03/24  1953  "

## 2024-06-28 NOTE — ED Notes (Signed)
 Pt was able to drink and hold down 480 ml of Gatorade.

## 2024-06-28 NOTE — ED Notes (Signed)
 Pt given Gatorade for PO challenge

## 2024-06-28 NOTE — Discharge Instructions (Addendum)
 It was a pleasure taking care of you here today.  As we discussed your flu test is positive which is likely the source of your symptoms.  I have written you for some nausea medication.  As well as some cough medicine.  May continue doing over-the-counter Tylenol , ibuprofen  as well as antihistamines like Zyrtec  and Flonase   Follow-up outpatient, return for new or worsening symptoms

## 2024-07-18 ENCOUNTER — Other Ambulatory Visit: Payer: Self-pay | Admitting: Obstetrics and Gynecology
